# Patient Record
Sex: Female | Born: 1985 | Race: White | Hispanic: No | Marital: Single | Smoking: Never smoker
Health system: Southern US, Community
[De-identification: ages and names within clinical notes are randomized; demographics above are authoritative.]

## PROBLEM LIST (undated history)

## (undated) DIAGNOSIS — R87619 Unspecified abnormal cytological findings in specimens from cervix uteri: Secondary | ICD-10-CM

## (undated) HISTORY — PX: TONSILLECTOMY: SUR1361

## (undated) HISTORY — DX: Unspecified abnormal cytological findings in specimens from cervix uteri: R87.619

---
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
## 2009-12-21 ENCOUNTER — Emergency Department (HOSPITAL_COMMUNITY): Admission: EM | Admit: 2009-12-21 | Discharge: 2009-12-21 | Payer: Self-pay | Admitting: Emergency Medicine

## 2010-07-01 LAB — DIFFERENTIAL
Basophils Relative: 0 % (ref 0–1)
Lymphs Abs: 1.1 10*3/uL (ref 0.7–4.0)
Monocytes Relative: 6 % (ref 3–12)
Neutro Abs: 16.3 10*3/uL — ABNORMAL HIGH (ref 1.7–7.7)
Neutrophils Relative %: 88 % — ABNORMAL HIGH (ref 43–77)

## 2010-07-01 LAB — BASIC METABOLIC PANEL
Calcium: 9.9 mg/dL (ref 8.4–10.5)
GFR calc Af Amer: >60 mL/min (ref >60)
GFR calc non Af Amer: >60 mL/min (ref >60)
Sodium: 139 mEq/L (ref 135–145)

## 2010-07-01 LAB — CBC
Hemoglobin: 15.2 g/dL — ABNORMAL HIGH (ref 12.0–15.0)
MCHC: 33.5 g/dL (ref 30.0–36.0)
RBC: 4.86 MIL/uL (ref 3.87–5.11)
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
## 2012-06-02 ENCOUNTER — Emergency Department (HOSPITAL_COMMUNITY)
Admission: EM | Admit: 2012-06-02 | Discharge: 2012-06-03 | Disposition: A | Discharge status: Home / Self Care | Payer: Managed Care, Other (non HMO) | Attending: Emergency Medicine | Admitting: Emergency Medicine

## 2012-06-02 ENCOUNTER — Encounter (HOSPITAL_COMMUNITY): Payer: Self-pay

## 2012-06-02 DIAGNOSIS — R300 Dysuria: Secondary | ICD-10-CM | POA: Insufficient documentation

## 2012-06-02 DIAGNOSIS — N898 Other specified noninflammatory disorders of vagina: Secondary | ICD-10-CM | POA: Insufficient documentation

## 2012-06-02 DIAGNOSIS — N949 Unspecified condition associated with female genital organs and menstrual cycle: Secondary | ICD-10-CM | POA: Insufficient documentation

## 2012-06-02 DIAGNOSIS — R102 Pelvic and perineal pain: Secondary | ICD-10-CM

## 2012-06-02 DIAGNOSIS — Z3202 Encounter for pregnancy test, result negative: Secondary | ICD-10-CM | POA: Insufficient documentation

## 2012-06-02 LAB — URINALYSIS, ROUTINE W REFLEX MICROSCOPIC
Glucose, UA: NEGATIVE mg/dL
Hgb urine dipstick: NEGATIVE
Nitrite: NEGATIVE
Protein, ur: NEGATIVE mg/dL
Specific Gravity, Urine: 1.036 — ABNORMAL HIGH (ref 1.005–1.030)
Urobilinogen, UA: 0.2 mg/dL (ref 0.0–1.0)
pH: 5.5 (ref 5.0–8.0)

## 2012-06-02 LAB — URINE MICROSCOPIC-ADD ON

## 2012-06-02 LAB — WET PREP, GENITAL: Trich, Wet Prep: NONE SEEN

## 2012-06-02 LAB — PREGNANCY, URINE: Preg Test, Ur: NEGATIVE

## 2012-06-02 MED ORDER — OXYCODONE-ACETAMINOPHEN 5-325 MG PO TABS
2.0000 | ORAL_TABLET | Freq: Once | ORAL | Status: AC
  Administered 2012-06-02: 2 via ORAL
  Filled 2012-06-02: qty 1

## 2012-06-02 MED ORDER — ONDANSETRON 4 MG PO TBDP
4.0000 mg | ORAL_TABLET | Freq: Once | ORAL | Status: AC
  Administered 2012-06-02: 4 mg via ORAL
  Filled 2012-06-02: qty 1

## 2012-06-02 NOTE — ED Provider Notes (Signed)
History     CSN: [id number]  Arrival date & time 06/02/12  2043   First MD Initiated Contact with Patient 06/02/12 2123      Chief Complaint  Patient presents with  . Abdominal Pain  . Vaginal Discharge    (Consider location/radiation/quality/duration/timing/severity/associated sxs/prior treatment) HPI  Patient presents to the emergency department complaints of vaginal discharge and dysuria for one month. She was seen this past Monday and given metronidazole and Diflucan for before and after the antibiotic. He says she is now having more pain than when she was seen last week. She's not having any abdominal pain or flank pain. She denies having any fever or vomiting. He continues to have discharge. She was seen in the ER and then originally and they did not give her any azithromycin or Rocephin. She says that she was so uncomfortable this evening she could not wait until Monday. nad vss  History reviewed. No pertinent past medical history.  Past Surgical History  Procedure Laterality Date  . Tonsillectomy      History reviewed. No pertinent family history.  History  Substance Use Topics  . Smoking status: Never Smoker   . Smokeless tobacco: Not on file  . Alcohol Use: No    OB History   Grav Para Term Preterm Abortions TAB SAB Ect Mult Living                  Review of Systems  Review of Systems  Gen: no weight loss, fevers, chills, night sweats  Eyes: no discharge or drainage, no occular pain or visual changes  Nose: no epistaxis or rhinorrhea  Mouth: no dental pain, no sore throat  Neck: no neck pain  Lungs:No wheezing, coughing or hemoptysis CV: no chest pain, palpitations, dependent edema or orthopnea  Abd: no abdominal pain, nausea, vomiting  GU: no dysuria or gross hematuria + vaginal discharge and burning during urination MSK:  No abnormalities  Neuro: no headache, no focal neurologic deficits  Skin: no abnormalities Psyche: negative.   Allergies   Latex  Home Medications   Current Outpatient Rx  Name  Route  Sig  Dispense  Refill  . metroNIDAZOLE (FLAGYL) 500 MG tablet   Oral   Take 500 mg by mouth 2 (two) times daily. Stop dat 06/04/2012         . Norgestim-Eth Estrad Triphasic (TRI-SPRINTEC PO)   Oral   Take 1 tablet by mouth daily.         Marland Kitchen HYDROcodone-acetaminophen (NORCO/VICODIN) 5-325 MG per tablet   Oral   Take 1 tablet by mouth every 4 (four) hours as needed for pain.   10 tablet   0     BP 118/72  Pulse 69  Temp(Src) 98.1 F (36.7 C) (Oral)  Resp 17  SpO2 97%  LMP 05/15/2012  Physical Exam  Nursing note and vitals reviewed. Constitutional: She appears well-developed and well-nourished. No distress.  HENT:  Head: Normocephalic and atraumatic.  Eyes: Pupils are equal, round, and reactive to light.  Neck: Normal range of motion. Neck supple.  Cardiovascular: Normal rate and regular rhythm.   Pulmonary/Chest: Effort normal.  Abdominal: Soft.  Genitourinary: There is no rash or tenderness on the right labia. There is no rash or tenderness on the left labia. Uterus is tender. Cervix exhibits discharge. Right adnexum displays tenderness. There is tenderness around the vagina. Vaginal discharge found.  Neurological: She is alert.  Skin: Skin is warm and dry.    ED Course  Procedures (including critical care time)  Labs Reviewed  WET PREP, GENITAL - Abnormal; Notable for the following:    WBC, Wet Prep HPF POC FEW (*)    All other components within normal limits  URINALYSIS, ROUTINE W REFLEX MICROSCOPIC - Abnormal; Notable for the following:    Color, Urine AMBER (*)    APPearance CLOUDY (*)    Specific Gravity, Urine 1.036 (*)    Bilirubin Urine SMALL (*)    Ketones, ur TRACE (*)    Leukocytes, UA SMALL (*)    All other components within normal limits  GC/CHLAMYDIA PROBE AMP  PREGNANCY, URINE  URINE MICROSCOPIC-ADD ON   US Transvaginal Non-ob  06/03/2012  *RADIOLOGY REPORT*  Clinical  Data:  Burning and pain with urination.  TRANSABDOMINAL AND TRANSVAGINAL ULTRASOUND OF PELVIS DOPPLER ULTRASOUND OF OVARIES  Technique:  Both transabdominal and transvaginal ultrasound examinations of the pelvis were performed. Transabdominal technique was performed for global imaging of the pelvis including uterus, ovaries, adnexal regions, and pelvic cul-de-sac.  It was necessary to proceed with endovaginal exam following the transabdominal exam to visualize the uterus and ovaries in greater detail.  Color and duplex Doppler ultrasound was utilized to evaluate blood flow to the ovaries.  Comparison:  CT of the abdomen and pelvis performed 12/21/2009  Findings:  Uterus:  Normal in size and appearance; measures 7.0 x 3.2 x 3.8 cm.  A Nabothian cyst is noted at the cervix.  Endometrium:  Normal in thickness and appearance; measures 0.7 cm in thickness.  Right ovary: Difficult to characterize; measures 1.7 x 1.4 x 1.2 cm.  No definite adnexal masses seen.  Left ovary:   Normal appearance/no adnexal mass; measures 2.8 x 1.1 x 1.6 cm.  Pulsed Doppler evaluation demonstrates normal low-resistance arterial and venous waveforms in the left ovary; the right ovary is difficult to characterize, but there is no ancillary evidence for ovarian torsion.  IMPRESSION: Normal exam.  No evidence of pelvic mass or other significant abnormality.  No sonographic evidence for ovarian torsion.  The right ovary is difficult to characterize, but no ancillary findings are seen to suggest ovarian torsion.   Original Report Authenticated By: Tonia Ghent, M.D.    US Pelvis Complete  06/03/2012  *RADIOLOGY REPORT*  Clinical Data:  Burning and pain with urination.  TRANSABDOMINAL AND TRANSVAGINAL ULTRASOUND OF PELVIS DOPPLER ULTRASOUND OF OVARIES  Technique:  Both transabdominal and transvaginal ultrasound examinations of the pelvis were performed. Transabdominal technique was performed for global imaging of the pelvis including uterus,  ovaries, adnexal regions, and pelvic cul-de-sac.  It was necessary to proceed with endovaginal exam following the transabdominal exam to visualize the uterus and ovaries in greater detail.  Color and duplex Doppler ultrasound was utilized to evaluate blood flow to the ovaries.  Comparison:  CT of the abdomen and pelvis performed 12/21/2009  Findings:  Uterus:  Normal in size and appearance; measures 7.0 x 3.2 x 3.8 cm.  A Nabothian cyst is noted at the cervix.  Endometrium:  Normal in thickness and appearance; measures 0.7 cm in thickness.  Right ovary: Difficult to characterize; measures 1.7 x 1.4 x 1.2 cm.  No definite adnexal masses seen.  Left ovary:   Normal appearance/no adnexal mass; measures 2.8 x 1.1 x 1.6 cm.  Pulsed Doppler evaluation demonstrates normal low-resistance arterial and venous waveforms in the left ovary; the right ovary is difficult to characterize, but there is no ancillary evidence for ovarian torsion.  IMPRESSION: Normal exam.  No evidence of  pelvic mass or other significant abnormality.  No sonographic evidence for ovarian torsion.  The right ovary is difficult to characterize, but no ancillary findings are seen to suggest ovarian torsion.   Original Report Authenticated By: Tonia Ghent, M.D.    [place] Art/ven Flow Abd Pelv Doppler  06/03/2012  *RADIOLOGY REPORT*  Clinical Data:  Burning and pain with urination.  TRANSABDOMINAL AND TRANSVAGINAL ULTRASOUND OF PELVIS DOPPLER ULTRASOUND OF OVARIES  Technique:  Both transabdominal and transvaginal ultrasound examinations of the pelvis were performed. Transabdominal technique was performed for global imaging of the pelvis including uterus, ovaries, adnexal regions, and pelvic cul-de-sac.  It was necessary to proceed with endovaginal exam following the transabdominal exam to visualize the uterus and ovaries in greater detail.  Color and duplex Doppler ultrasound was utilized to evaluate blood flow to the ovaries.  Comparison:  CT of the  abdomen and pelvis performed 12/21/2009  Findings:  Uterus:  Normal in size and appearance; measures 7.0 x 3.2 x 3.8 cm.  A Nabothian cyst is noted at the cervix.  Endometrium:  Normal in thickness and appearance; measures 0.7 cm in thickness.  Right ovary: Difficult to characterize; measures 1.7 x 1.4 x 1.2 cm.  No definite adnexal masses seen.  Left ovary:   Normal appearance/no adnexal mass; measures 2.8 x 1.1 x 1.6 cm.  Pulsed Doppler evaluation demonstrates normal low-resistance arterial and venous waveforms in the left ovary; the right ovary is difficult to characterize, but there is no ancillary evidence for ovarian torsion.  IMPRESSION: Normal exam.  No evidence of pelvic mass or other significant abnormality.  No sonographic evidence for ovarian torsion.  The right ovary is difficult to characterize, but no ancillary findings are seen to suggest ovarian torsion.   Original Report Authenticated By: Tonia Ghent, M.D.      1. Pelvic pain       MDM  Lab results are unremarkable. Due to her complaints of continued burning that is intolerable, I have added on a  [place] to r/o torsion or ovarian cysts.   Normal [place]. Pt given Azithro and Rocephin in ED for small amount of WBCs on wet prep. Will give referral to Gyn and short course of pain medications.  Pt has been advised of the symptoms that warrant their return to the ED. Patient has voiced understanding and has agreed to follow-up with the PCP or specialist.       Dorthula Matas, PA 06/03/12 [phone]

## 2012-06-02 NOTE — ED Notes (Signed)
Pt states she has been having difficulty with vaginal discharge and abdominal pain x 1 month.  States dx with bv on Monday and given antibiotic.  Pt states pain is worsening.  Pain increases with urination.  No flank pain.  No fever.   No blood in urine noted.

## 2012-06-03 ENCOUNTER — Emergency Department (HOSPITAL_COMMUNITY): Payer: Managed Care, Other (non HMO)

## 2012-06-03 MED ORDER — CEFTRIAXONE SODIUM 250 MG IJ SOLR
250.0000 mg | Freq: Once | INTRAMUSCULAR | Status: AC
  Administered 2012-06-03: 250 mg via INTRAMUSCULAR
  Filled 2012-06-03: qty 250

## 2012-06-03 MED ORDER — LIDOCAINE HCL 1 % IJ SOLN
INTRAMUSCULAR | Status: AC
  Administered 2012-06-03: 1 mL
  Filled 2012-06-03: qty 20

## 2012-06-03 MED ORDER — OXYCODONE-ACETAMINOPHEN 5-325 MG PO TABS
1.0000 | ORAL_TABLET | Freq: Once | ORAL | Status: AC
  Administered 2012-06-03: 1 via ORAL
  Filled 2012-06-03: qty 1

## 2012-06-03 MED ORDER — HYDROCODONE-ACETAMINOPHEN 5-325 MG PO TABS: 1.0000 | ORAL_TABLET | ORAL | Status: DC | PRN

## 2012-06-03 MED ORDER — AZITHROMYCIN 250 MG PO TABS
500.0000 mg | ORAL_TABLET | Freq: Once | ORAL | Status: AC
  Administered 2012-06-03: 500 mg via ORAL
  Filled 2012-06-03: qty 2

## 2012-06-03 NOTE — ED Provider Notes (Signed)
Medical screening examination/treatment/procedure(s) were performed by non-physician practitioner and as supervising physician I was immediately available for consultation/collaboration.  Timeka Goette, MD 06/03/12 1500 

## 2012-06-03 NOTE — ED Notes (Signed)
Informed by [employer] that they will be here in over an hour.  Pt and PA made aware.

## 2012-06-04 LAB — GC/CHLAMYDIA PROBE AMP: CT Probe RNA: NEGATIVE

## 2013-06-12 ENCOUNTER — Encounter: Payer: Self-pay | Admitting: Certified Nurse Midwife

## 2013-06-18 ENCOUNTER — Ambulatory Visit: Payer: Self-pay | Admitting: Certified Nurse Midwife

## 2013-06-28 ENCOUNTER — Encounter: Payer: Self-pay | Admitting: Certified Nurse Midwife

## 2013-06-28 ENCOUNTER — Ambulatory Visit (INDEPENDENT_AMBULATORY_CARE_PROVIDER_SITE_OTHER): Payer: Managed Care, Other (non HMO) | Admitting: Certified Nurse Midwife

## 2013-06-28 VITALS — BP 119/84 | HR 86 | Resp 16 | Ht 65.25 in | Wt 197.0 lb

## 2013-06-28 DIAGNOSIS — Z0000 Encounter for general adult medical examination without abnormal findings: Secondary | ICD-10-CM

## 2013-06-28 DIAGNOSIS — Z309 Encounter for contraceptive management, unspecified: Secondary | ICD-10-CM

## 2013-06-28 DIAGNOSIS — Z01419 Encounter for gynecological examination (general) (routine) without abnormal findings: Secondary | ICD-10-CM

## 2013-06-28 LAB — POCT URINALYSIS DIPSTICK
Bilirubin, UA: NEGATIVE
Glucose, UA: NEGATIVE
Ketones, UA: NEGATIVE
Leukocytes, UA: NEGATIVE
Nitrite, UA: NEGATIVE
PH UA: 5
PROTEIN UA: NEGATIVE
RBC UA: NEGATIVE
UROBILINOGEN UA: NEGATIVE

## 2013-06-28 LAB — HEMOGLOBIN, FINGERSTICK: Hemoglobin, fingerstick: 14.3 g/dL (ref 12.0–16.0)

## 2013-06-28 MED ORDER — NORGESTIM-ETH ESTRAD TRIPHASIC 0.18/0.215/0.25 MG-35 MCG PO TABS: 1.0000 | ORAL_TABLET | Freq: Every day | ORAL | Status: DC

## 2013-06-28 NOTE — Progress Notes (Signed)
28 y.o. G0P0000 Single Caucasian Fe here for annual exam. Periods normal no issues. Contraception working well, no problems. Partner change, so desires STD screening. Good year. No other health changes today or concerns.    Patient's last menstrual period was 06/07/2013.          Sexually active: yes  The current method of family planning is OCP (estrogen/progesterone).    Exercising: no  exercise Smoker:  no  Health Maintenance: Pap:  12-13 neg MMG:  2010 Colonoscopy:  none BMD:   none TDaP:  2007 Labs: Poct urine-neg, Hgb-14.3 Self breast exam: done monthly   reports that she has never smoked. She does not have any smokeless tobacco history on file. She reports that she does not drink alcohol or use illicit drugs.  No past medical history on file.  Past Surgical History  Procedure Laterality Date  . Tonsillectomy      Current Outpatient Prescriptions  Medication Sig Dispense Refill  . HYDROcodone-acetaminophen (NORCO/VICODIN) 5-325 MG per tablet Take 1 tablet by mouth every 4 (four) hours as needed for pain.  10 tablet  0  . metroNIDAZOLE (FLAGYL) 500 MG tablet Take 500 mg by mouth 2 (two) times daily. Stop dat 06/04/2012      . Norgestim-Eth Estrad Triphasic (TRI-SPRINTEC PO) Take 1 tablet by mouth daily.       No current facility-administered medications for this visit.    Family History  Problem Relation Age of Onset  . Hypertension Mother   . Breast cancer Maternal Grandmother   . Cancer Maternal Grandfather     lung    ROS:  Pertinent items are noted in HPI.  Otherwise, a comprehensive ROS was negative.  Exam:   Ht 5' 5.25" (1.657 m)  Wt 197 lb (89.359 kg)  BMI 32.55 kg/m2  LMP 06/07/2013 Height: 5' 5.25" (165.7 cm)  Ht Readings from Last 3 Encounters:  06/28/13 5' 5.25" (1.657 m)    General appearance: alert, cooperative and appears stated age Head: Normocephalic, without obvious abnormality, atraumatic Neck: no adenopathy, supple, symmetrical, trachea  midline and thyroid normal to inspection and palpation and non-palpable Lungs: clear to auscultation bilaterally Breasts: normal appearance, no masses or tenderness, No nipple retraction or dimpling, No nipple discharge or bleeding, No axillary or supraclavicular adenopathy Heart: regular rate and rhythm Abdomen: soft, non-tender; no masses,  no organomegaly Extremities: extremities normal, atraumatic, no cyanosis or edema Skin: Skin color, texture, turgor normal. No rashes or lesions Lymph nodes: Cervical, supraclavicular, and axillary nodes normal. No abnormal inguinal nodes palpated Neurologic: Grossly normal   Pelvic: External genitalia:  no lesions              Urethra:  normal appearing urethra with no masses, tenderness or lesions              Bartholin's and Skene's: normal                 Vagina: normal appearing vagina with normal color and discharge, no lesions              Cervix: normal, non tender              Pap taken: no Bimanual Exam:  Uterus:  normal size, contour, position, consistency, mobility, non-tender and anteverted              Adnexa: normal adnexa and no mass, fullness, tenderness               Rectovaginal: Confirms  Anus:  normal sphincter tone, no lesions  A:  Well Woman with normal exam  Contraception OCP desired  STD screening  P:   Reviewed health and wellness pertinent to exam  Rx Trisprintec see order  Lab:GC,Chlamydia,HIV,RPR  Pap smear as per guidelines   pap smear not taken today  counseled on breast self exam, STD prevention, HIV risk factors and prevention, use and side effects of OCP's, adequate intake of calcium and vitamin D  return annually or prn  An After Visit Summary was printed and given to the patient.

## 2013-06-28 NOTE — Patient Instructions (Signed)
General topics  Next pap or exam is  due in 1 year Take a Women's multivitamin Take 1200 mg. of calcium daily - prefer dietary If any concerns in interim to call back  Breast Self-Awareness Practicing breast self-awareness may pick up problems early, prevent significant medical complications, and possibly save your life. By practicing breast self-awareness, you can become familiar with how your breasts look and feel and if your breasts are changing. This allows you to notice changes early. It can also offer you some reassurance that your breast health is good. One way to learn what is normal for your breasts and whether your breasts are changing is to do a breast self-exam. If you find a lump or something that was not present in the past, it is best to contact your caregiver right away. Other findings that should be evaluated by your caregiver include nipple discharge, especially if it is bloody; skin changes or reddening; areas where the skin seems to be pulled in (retracted); or new lumps and bumps. Breast pain is seldom associated with cancer (malignancy), but should also be evaluated by a caregiver. BREAST SELF-EXAM The best time to examine your breasts is 5 7 days after your menstrual period is over.  ExitCare Patient Information 2013 [employer].   Exercise to Stay Healthy Exercise helps you become and stay healthy. EXERCISE IDEAS AND TIPS Choose exercises that:  You enjoy.  Fit into your day. You do not need to exercise really hard to be healthy. You can do exercises at a slow or medium level and stay healthy. You can:  Stretch before and after working out.  Try yoga, Pilates, or tai chi.  Lift weights.  Walk fast, swim, jog, run, climb stairs, bicycle, dance, or rollerskate.  Take aerobic classes. Exercises that burn about 150 calories:  Running 1  miles in 15 minutes.  Playing volleyball for 45 to 60 minutes.  Washing and waxing a car for 45 to 60  minutes.  Playing touch football for 45 minutes.  Walking 1  miles in 35 minutes.  Pushing a stroller 1  miles in 30 minutes.  Playing basketball for 30 minutes.  Raking leaves for 30 minutes.  Bicycling 5 miles in 30 minutes.  Walking 2 miles in 30 minutes.  Dancing for 30 minutes.  Shoveling snow for 15 minutes.  Swimming laps for 20 minutes.  Walking up stairs for 15 minutes.  Bicycling 4 miles in 15 minutes.  Gardening for 30 to 45 minutes.  Jumping rope for 15 minutes.  Washing windows or floors for 45 to 60 minutes. Document Released: 05/07/2010 Document Revised: 06/27/2011 Document Reviewed: 05/07/2010 ExitCare Patient Information 2013 [employer].   Other topics ( that may be useful information):    Sexually Transmitted Disease Sexually transmitted disease (STD) refers to any infection that is passed from person to person during sexual activity. This may happen by way of saliva, semen, blood, vaginal mucus, or urine. Common STDs include:  Gonorrhea.  Chlamydia.  Syphilis.  HIV/AIDS.  Genital herpes.  Hepatitis B and C.  Trichomonas.  Human papillomavirus (HPV).  Pubic lice. CAUSES  An STD may be spread by bacteria, virus, or parasite. A person can get an STD by:  Sexual intercourse with an infected person.  Sharing sex toys with an infected person.  Sharing needles with an infected person.  Having intimate contact with the genitals, mouth, or rectal areas of an infected person. SYMPTOMS  Some people may not have any symptoms, but   not have any symptoms, but they can still pass the infection to others. Different STDs have different symptoms. Symptoms include:  Painful or bloody urination.  Pain in the pelvis, abdomen, vagina, anus, throat, or eyes.  Skin rash, itching, irritation, growths, or sores (lesions). These usually occur in the genital or anal area.  Abnormal vaginal discharge.  Penile discharge in men.  Soft, flesh-colored skin growths in the  genital or anal area.  Fever.  Pain or bleeding during sexual intercourse.  Swollen glands in the groin area.  Yellow skin and eyes (jaundice). This is seen with hepatitis. DIAGNOSIS  To make a diagnosis, your caregiver may:  Take a medical history.  Perform a physical exam.  Take a specimen (culture) to be examined.  Examine a sample of discharge under a microscope.  Perform blood test TREATMENT   Chlamydia, gonorrhea, trichomonas, and syphilis can be cured with antibiotic medicine.  Genital herpes, hepatitis, and HIV can be treated, but not cured, with prescribed medicines. The medicines will lessen the symptoms.  Genital warts from HPV can be treated with medicine or by freezing, burning (electrocautery), or surgery. Warts may come back.  HPV is a virus and cannot be cured with medicine or surgery.However, abnormal areas may be followed very closely by your caregiver and may be removed from the cervix, vagina, or vulva through office procedures or surgery. If your diagnosis is confirmed, your recent sexual partners need treatment. This is true even if they are symptom-free or have a negative culture or evaluation. They should not have sex until their caregiver says it is okay. HOME CARE INSTRUCTIONS  All sexual partners should be informed, tested, and treated for all STDs.  Take your antibiotics as directed. Finish them even if you start to feel better.  Only take over-the-counter or prescription medicines for pain, discomfort, or fever as directed by your caregiver.  Rest.  Eat a balanced diet and drink enough fluids to keep your urine clear or pale yellow.  Do not have sex until treatment is completed and you have followed up with your caregiver. STDs should be checked after treatment.  Keep all follow-up appointments, Pap tests, and blood tests as directed by your caregiver.  Only use latex condoms and water-soluble lubricants during sexual activity. Do not use  petroleum jelly or oils.  Avoid alcohol and illegal drugs.  Get vaccinated for HPV and hepatitis. If you have not received these vaccines in the past, talk to your caregiver about whether one or both might be right for you.  Avoid risky sex practices that can break the skin. The only way to avoid getting an STD is to avoid all sexual activity.Latex condoms and dental dams (for oral sex) will help lessen the risk of getting an STD, but will not completely eliminate the risk. SEEK MEDICAL CARE IF:   You have a fever.  You have any new or worsening symptoms. Document Released: 06/25/2002 Document Revised: 06/27/2011 Document Reviewed: 07/02/2010 [facility] Patient Information 2013 [place].    Domestic Abuse You are being battered or abused if someone close to you hits, pushes, or physically hurts you in any way. You also are being abused if you are forced into activities. You are being sexually abused if you are forced to have sexual contact of any kind. You are being emotionally abused if you are made to feel worthless or if you are constantly threatened. It is important to remember that help is available. No one has the right to  ABUSE  Learn the warning signs of danger. This varies with situations but may include: the use of alcohol, threats, isolation from friends and family, or forced sexual contact. Leave if you feel that violence is going to occur.  If you are attacked or beaten, report it to the police so the abuse is documented. You do not have to press charges. The police can protect you while you or the attackers are leaving. Get the officer's name and badge number and a copy of the report.  Find someone you can trust and tell them what is happening to you: your caregiver, a nurse, clergy member, close friend or family member. Feeling ashamed is natural, but remember that you have done nothing wrong. No one deserves abuse. Document Released:  04/01/2000 Document Revised: 06/27/2011 Document Reviewed: 06/10/2010 ExitCare Patient Information 2013 [employer].    How Much is Too Much Alcohol? Drinking too much alcohol can cause injury, accidents, and health problems. These types of problems can include:   Car crashes.  Falls.  Family fighting (domestic violence).  Drowning.  Fights.  Injuries.  Burns.  Damage to certain organs.  Having a baby with birth defects. ONE DRINK CAN BE TOO MUCH WHEN YOU ARE:  Working.  Pregnant or breastfeeding.  Taking medicines. Ask your doctor.  Driving or planning to drive. If you or someone you know has a drinking problem, get help from a doctor.  Document Released: 01/29/2009 Document Revised: 06/27/2011 Document Reviewed: 01/29/2009 ExitCare Patient Information 2013 [employer].   Smoking Hazards Smoking cigarettes is extremely bad for your health. Tobacco smoke has over 200 known poisons in it. There are over 60 chemicals in tobacco smoke that cause cancer. Some of the chemicals found in cigarette smoke include:   Cyanide.  Benzene.  Formaldehyde.  Methanol (wood alcohol).  Acetylene (fuel used in welding torches).  Ammonia. Cigarette smoke also contains the poisonous gases nitrogen oxide and carbon monoxide.  Cigarette smokers have an increased risk of many serious medical problems and Smoking causes approximately:  90% of all lung cancer deaths in men.  80% of all lung cancer deaths in women.  90% of deaths from chronic obstructive lung disease. Compared with nonsmokers, smoking increases the risk of:  Coronary heart disease by 2 to 4 times.  Stroke by 2 to 4 times.  Men developing lung cancer by 23 times.  Women developing lung cancer by 13 times.  Dying from chronic obstructive lung diseases by 12 times.  . Smoking is the most preventable cause of death and disease in our society.  WHY IS SMOKING ADDICTIVE?  Nicotine is the chemical  agent in tobacco that is capable of causing addiction or dependence.  When you smoke and inhale, nicotine is absorbed rapidly into the bloodstream through your lungs. Nicotine absorbed through the lungs is capable of creating a powerful addiction. Both inhaled and non-inhaled nicotine may be addictive.  Addiction studies of cigarettes and spit tobacco show that addiction to nicotine occurs mainly during the teen years, when young people begin using tobacco products. WHAT ARE THE BENEFITS OF QUITTING?  There are many health benefits to quitting smoking.   Likelihood of developing cancer and heart disease decreases. Health improvements are seen almost immediately.  Blood pressure, pulse rate, and breathing patterns start returning to normal soon after quitting. QUITTING SMOKING   American Lung Association - 1-800-LUNGUSA  American Cancer Society - 1-800-ACS-2345 Document Released: 05/12/2004 Document Revised: 06/27/2011 Document Reviewed: 01/14/2009 ExitCare Patient Information 2013 ExitCare,   ExitCare Patient Information 2013 [place].   Stress Management Stress is a state of physical or mental tension that often results from changes in your life or normal routine. Some common causes of stress are:  Death of a loved one.  Injuries or severe illnesses.  Getting fired or changing jobs.  Moving into a new home. Other causes may be:  Sexual problems.  Business or financial losses.  Taking on a large debt.  Regular conflict with someone at home or at work.  Constant tiredness from lack of sleep. It is not just bad things that are stressful. It may be stressful to:  Win the lottery.  Get married.  Buy a new car. The amount of stress that can be easily tolerated varies from person to person. Changes generally cause stress, regardless of the types of change. Too much stress can affect your health. It may lead to physical or emotional problems. Too little stress (boredom) may also become stressful. SUGGESTIONS TO  REDUCE STRESS:  Talk things over with your family and friends. It often is helpful to share your concerns and worries. If you feel your problem is serious, you may want to get help from a professional counselor.  Consider your problems one at a time instead of lumping them all together. Trying to take care of everything at once may seem impossible. List all the things you need to do and then start with the most important one. Set a goal to accomplish 2 or 3 things each day. If you expect to do too many in a single day you will naturally fail, causing you to feel even more stressed.  Do not use alcohol or drugs to relieve stress. Although you may feel better for a short time, they do not remove the problems that caused the stress. They can also be habit forming.  Exercise regularly - at least 3 times per week. Physical exercise can help to relieve that "uptight" feeling and will relax you.  The shortest distance between despair and hope is often a good night's sleep.  Go to bed and get up on time allowing yourself time for appointments without being rushed.  Take a short "time-out" period from any stressful situation that occurs during the day. Close your eyes and take some deep breaths. Starting with the muscles in your face, tense them, hold it for a few seconds, then relax. Repeat this with the muscles in your neck, shoulders, hand, stomach, back and legs.  Take good care of yourself. Eat a balanced diet and get plenty of rest.  Schedule time for having fun. Take a break from your daily routine to relax. HOME CARE INSTRUCTIONS   Call if you feel overwhelmed by your problems and feel you can no longer manage them on your own.  Return immediately if you feel like hurting yourself or someone else. Document Released: 09/28/2000 Document Revised: 06/27/2011 Document Reviewed: 05/21/2007 [facility] Patient Information 2013 [place].  It was good to see you today. Enjoy your  spring! Debbi

## 2013-06-29 LAB — RPR

## 2013-06-29 LAB — HIV ANTIBODY (ROUTINE TESTING W REFLEX): HIV: NONREACTIVE

## 2013-07-03 LAB — IPS N GONORRHOEA AND CHLAMYDIA BY PCR

## 2013-07-05 NOTE — Progress Notes (Signed)
Reviewed personally.  M. Suzanne Nolyn Swab, MD.  

## 2014-06-06 IMAGING — US US ART/VEN ABD/PELV/SCROTUM DOPPLER LTD
1 series · 13 of 25 positions shown · non-contrast
Comparison: CT of the abdomen and pelvis performed 12/21/2009

CLINICAL DATA: Burning and pain with urination.

TRANSABDOMINAL AND TRANSVAGINAL ULTRASOUND OF PELVIS
DOPPLER ULTRASOUND OF OVARIES
TECHNIQUE: Both transabdominal and transvaginal ultrasound
examinations of the pelvis were performed. Transabdominal technique
was performed for global imaging of the pelvis including uterus,
ovaries, adnexal regions, and pelvic cul-de-sac.
It was necessary to proceed with endovaginal exam following the
transabdominal exam to visualize the uterus and ovaries in greater
detail.
Color and duplex Doppler ultrasound was utilized to evaluate blood
flow to the ovaries.

[Series 1: us art/ven abd/pelv/scrotum doppler ltd · 0.31mm/px · 13 of 50 slices shown]
[im 1/50]
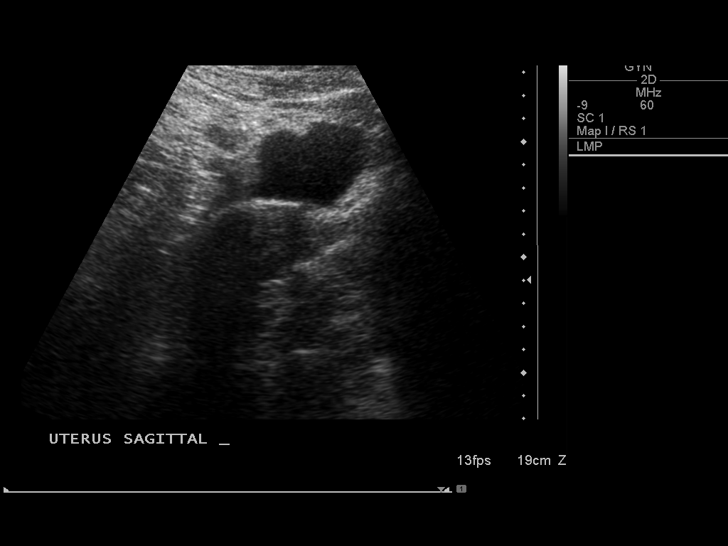
[im 5/50]
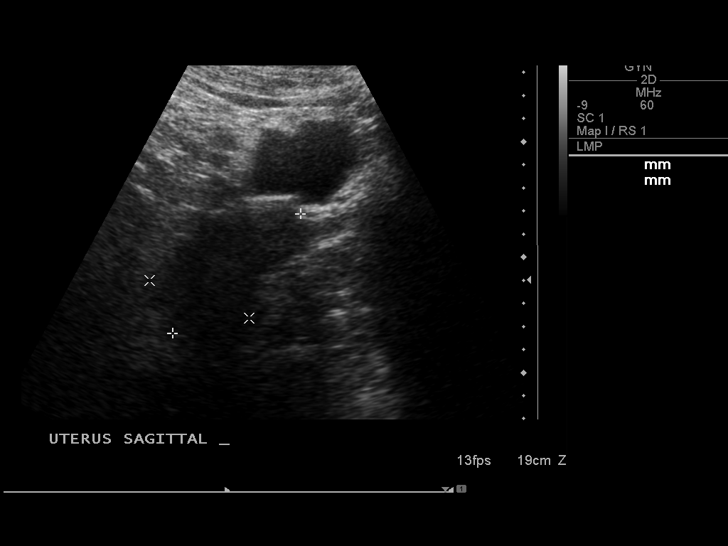
[im 9/50]
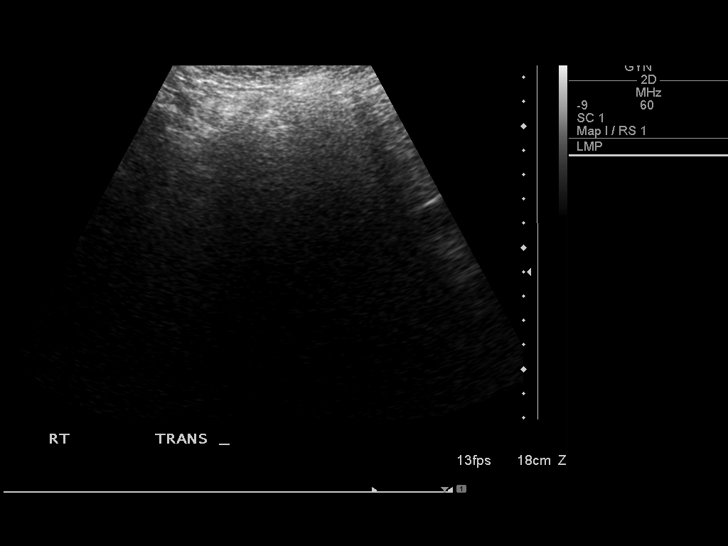
[im 13/50]
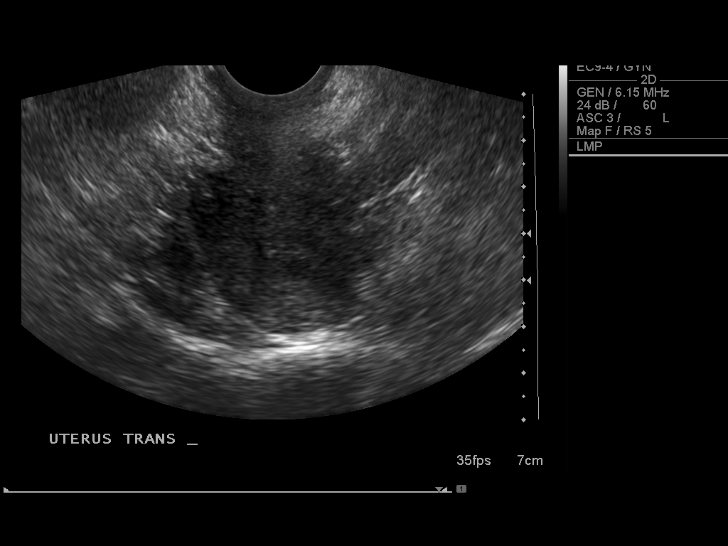
[im 17/50]
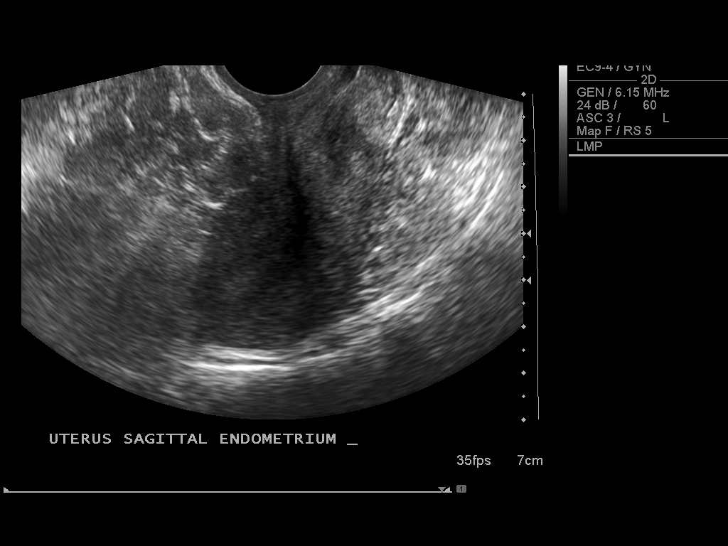
[im 21/50]
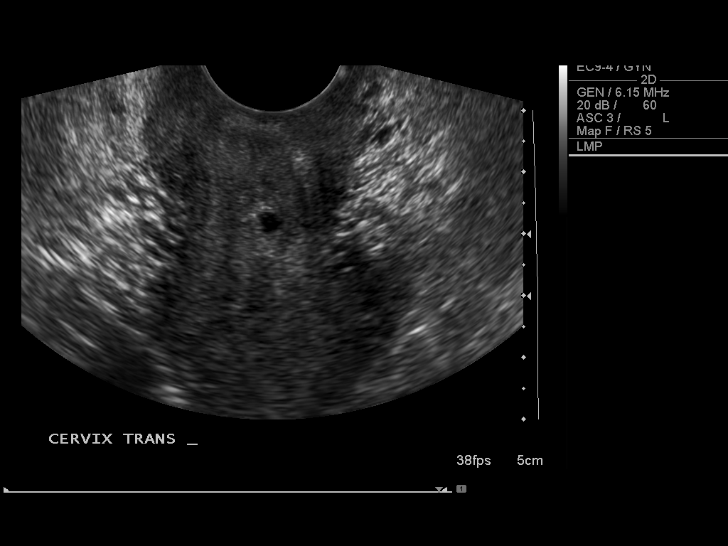
[im 25/50]
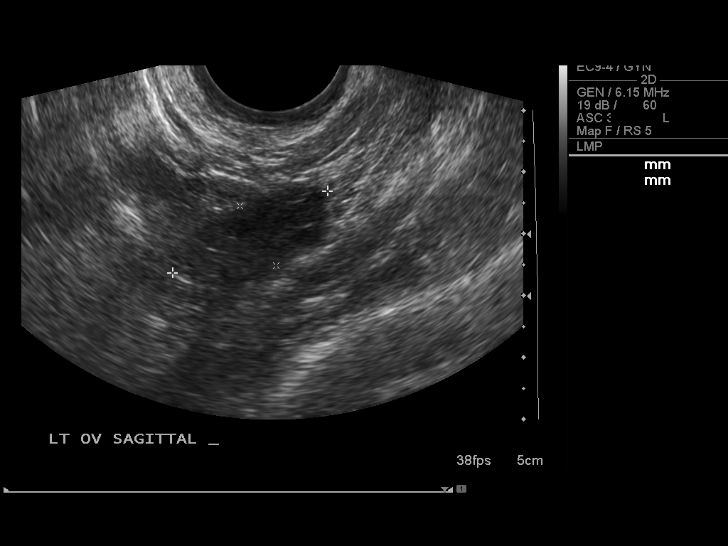
[im 29/50]
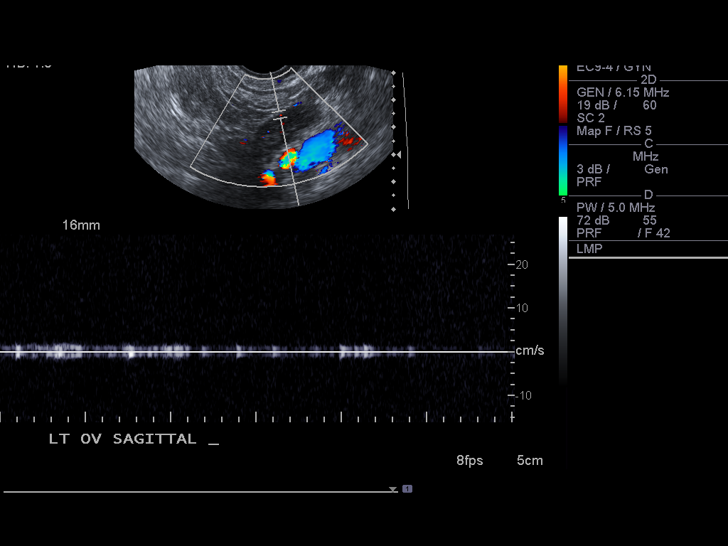
[im 33/50]
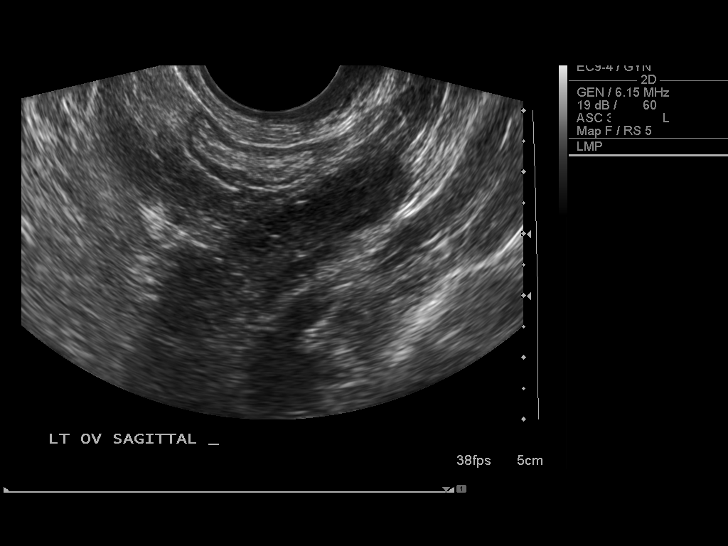
[im 37/50]
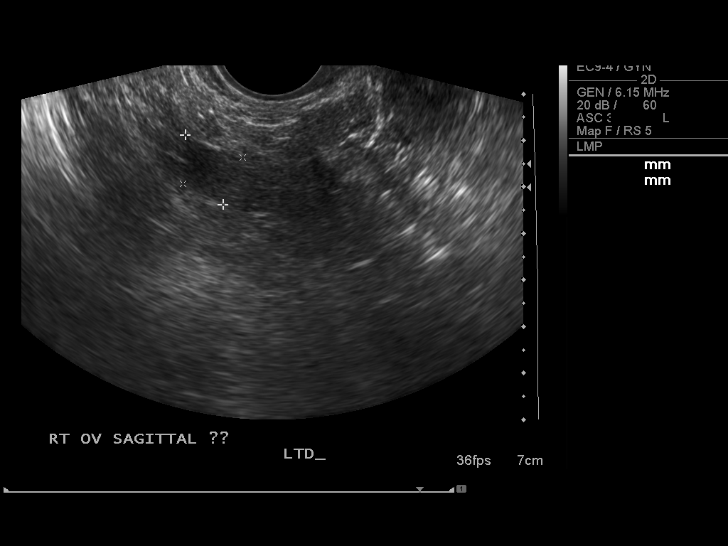
[im 41/50]
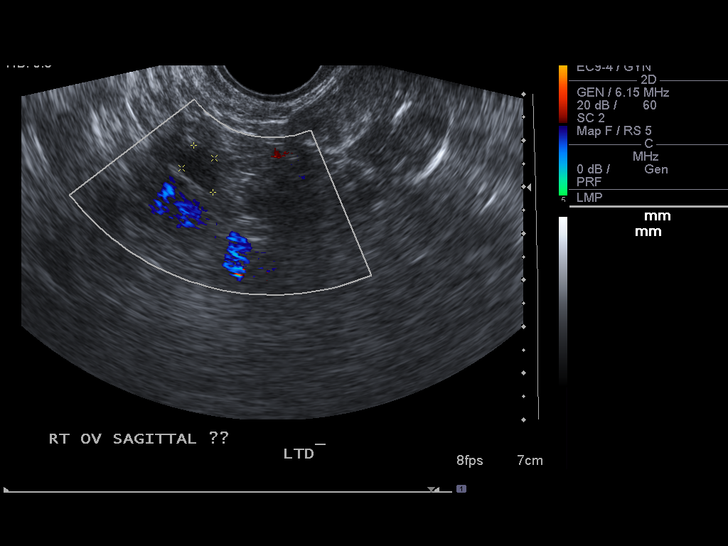
[im 45/50]
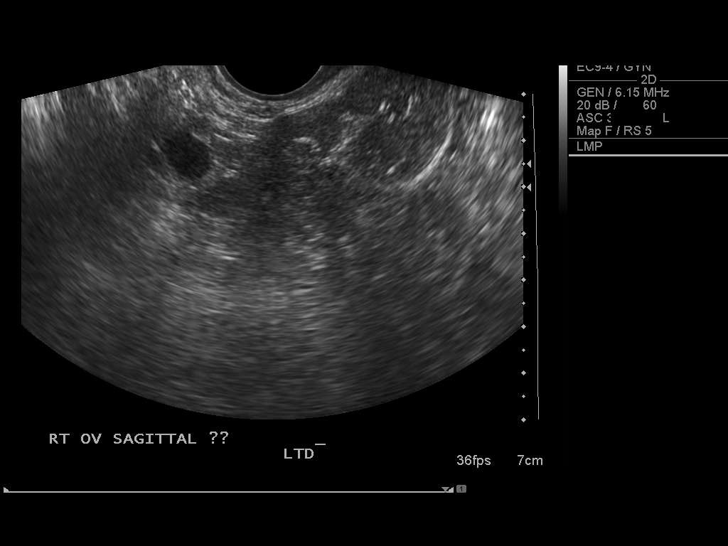
[im 50/50]
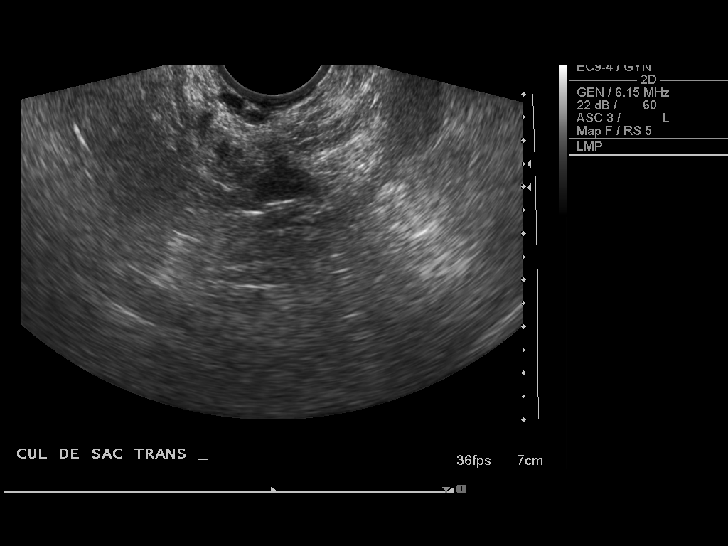

[13 of 25 positions shown; findings below may reference images not displayed]

FINDINGS: Uterus:  Normal in size and appearance; measures 7.0 x 3.2 x
cm.  A Nabothian cyst is noted at the cervix.

Endometrium:  Normal in thickness and appearance; measures 0.7 cm
in thickness.

Right ovary: Difficult to characterize; measures 1.7 x 1.4 x
cm.  No definite adnexal masses seen.

Left ovary:   Normal appearance/no adnexal mass; measures 2.8 x
x 1.6 cm.

Pulsed Doppler evaluation demonstrates normal low-resistance
arterial and venous waveforms in the left ovary; the right ovary is
difficult to characterize, but there is no ancillary evidence for
ovarian torsion.
IMPRESSION: Normal exam.  No evidence of pelvic mass or other significant
abnormality.

No sonographic evidence for ovarian torsion.  The right ovary is
difficult to characterize, but no ancillary findings are seen to
suggest ovarian torsion.

## 2014-06-09 ENCOUNTER — Other Ambulatory Visit: Payer: Self-pay | Admitting: Certified Nurse Midwife

## 2014-06-09 DIAGNOSIS — Z3040 Encounter for surveillance of contraceptives, unspecified: Secondary | ICD-10-CM

## 2014-06-09 MED ORDER — NORGESTIM-ETH ESTRAD TRIPHASIC 0.18/0.215/0.25 MG-35 MCG PO TABS: 1.0000 | ORAL_TABLET | Freq: Every day | ORAL | Status: DC

## 2014-06-09 NOTE — Telephone Encounter (Signed)
Pt rescheduled aex for 09/01/14 and requests refill on birth control until her appointment.

## 2014-06-09 NOTE — Telephone Encounter (Signed)
Medication refill request: OCP Last AEX:  06/28/13 DL Next AEX: 1/61/095/16/16 DL Last MMG (if hormonal medication request): None Refill authorized: 06/28/13 #3packs/4R. Today #3packs/0R?

## 2014-07-09 ENCOUNTER — Ambulatory Visit: Payer: Managed Care, Other (non HMO) | Admitting: Certified Nurse Midwife

## 2014-08-19 ENCOUNTER — Other Ambulatory Visit: Payer: Self-pay | Admitting: Certified Nurse Midwife

## 2014-08-20 NOTE — Telephone Encounter (Signed)
Medication refill request: Tri Sprintec  Last AEX:  06/28/13 DL Next AEX: 1/61/095/16/16 DL  Last MMG (if hormonal medication request): none Refill authorized: 06/09/14 #3packs/0R. Today #1pack/0R?

## 2014-09-01 ENCOUNTER — Encounter: Payer: Self-pay | Admitting: Certified Nurse Midwife

## 2014-09-01 ENCOUNTER — Ambulatory Visit (INDEPENDENT_AMBULATORY_CARE_PROVIDER_SITE_OTHER): Payer: 59 | Admitting: Certified Nurse Midwife

## 2014-09-01 VITALS — BP 102/70 | HR 70 | Resp 16 | Ht 65.25 in | Wt 226.0 lb

## 2014-09-01 DIAGNOSIS — Z124 Encounter for screening for malignant neoplasm of cervix: Secondary | ICD-10-CM | POA: Diagnosis not present

## 2014-09-01 DIAGNOSIS — Z01419 Encounter for gynecological examination (general) (routine) without abnormal findings: Secondary | ICD-10-CM

## 2014-09-01 DIAGNOSIS — Z0000 Encounter for general adult medical examination without abnormal findings: Secondary | ICD-10-CM | POA: Diagnosis not present

## 2014-09-01 DIAGNOSIS — Z3040 Encounter for surveillance of contraceptives, unspecified: Secondary | ICD-10-CM

## 2014-09-01 LAB — POCT URINALYSIS DIPSTICK
Bilirubin, UA: NEGATIVE
Blood, UA: NEGATIVE
GLUCOSE UA: NEGATIVE
Ketones, UA: NEGATIVE
LEUKOCYTES UA: NEGATIVE
NITRITE UA: NEGATIVE
Protein, UA: NEGATIVE
Urobilinogen, UA: NEGATIVE
pH, UA: 5

## 2014-09-01 LAB — LIPID PANEL
CHOL/HDL RATIO: 4.2 ratio
Cholesterol: 220 mg/dL — ABNORMAL HIGH (ref 0–200)
HDL: 52 mg/dL (ref >=46)
LDL Cholesterol: 148 mg/dL — ABNORMAL HIGH (ref 0–99)
Triglycerides: 100 mg/dL (ref <150)
VLDL: 20 mg/dL (ref 0–40)

## 2014-09-01 MED ORDER — NORGESTIM-ETH ESTRAD TRIPHASIC 0.18/0.215/0.25 MG-35 MCG PO TABS: 1.0000 | ORAL_TABLET | Freq: Every day | ORAL | Status: DC

## 2014-09-01 NOTE — Progress Notes (Signed)
29 y.o. G0P0000 Single  Caucasian Fe here for annual exam. Periods normal, no issues. No partner at present, so not sexually active. Contraception OCP desired again for contraception and cycle control. Ran out of OCP's. Aware she has gained weight 29 pounds over the past year. Had several extended family members who died and her pets have issues and she has felt stress eating is the problem. Plans working on weight loss and exercise with gym/trainer. Desires STD screening and cholesterol screening. No other health issues today. Sees urgent care if needed.  Patient's last menstrual period was 08/17/2014.          Sexually active: Yes.    The current method of family planning is OCP (estrogen/progesterone).    Exercising: No.  exercise Smoker:  no  Health Maintenance: Pap:  12/13 neg MMG:  2010 Colonoscopy:  none BMD:   none TDaP:  2009 Labs: Poct urine-neg, Hgb-14.3 Self breast exam: done weekly   reports that she has never smoked. She does not have any smokeless tobacco history on file. She reports that she does not drink alcohol or use illicit drugs.  History reviewed. No pertinent past medical history.  Past Surgical History  Procedure Laterality Date  . Tonsillectomy      Current Outpatient Prescriptions  Medication Sig Dispense Refill  . Norgestimate-Ethinyl Estradiol Triphasic 0.18/0.215/0.25 MG-35 MCG tablet Take 1 tablet by mouth daily. 1 Package 12   No current facility-administered medications for this visit.    Family History  Problem Relation Age of Onset  . Hypertension Mother   . Breast cancer Maternal Grandmother   . Cancer Maternal Grandfather     lung  . Cancer Paternal Grandfather     prostate  . Heart disease Paternal Grandfather     ROS:  Pertinent items are noted in HPI.  Otherwise, a comprehensive ROS was negative.  Exam:   BP 102/70 mmHg  Pulse 70  Resp 16  Ht 5' 5.25" (1.657 m)  Wt 226 lb (102.513 kg)  BMI 37.34 kg/m2  LMP 08/17/2014 Height:  5' 5.25" (165.7 cm) Ht Readings from Last 3 Encounters:  09/01/14 5' 5.25" (1.657 m)  06/28/13 5' 5.25" (1.657 m)    General appearance: alert, cooperative and appears stated age Head: Normocephalic, without obvious abnormality, atraumatic Neck: no adenopathy, supple, symmetrical, trachea midline and thyroid normal to inspection and palpation Lungs: clear to auscultation bilaterally Breasts: normal appearance, no masses or tenderness, No nipple retraction or dimpling, No nipple discharge or bleeding, No axillary or supraclavicular adenopathy, large pendulous Heart: regular rate and rhythm Abdomen: soft, non-tender; no masses,  no organomegaly Extremities: extremities normal, atraumatic, no cyanosis or edema Skin: Skin color, texture, turgor normal. No rashes or lesions Lymph nodes: Cervical, supraclavicular, and axillary nodes normal. No abnormal inguinal nodes palpated Neurologic: Grossly normal   Pelvic: External genitalia:  no lesions              Urethra:  normal appearing urethra with no masses, tenderness or lesions              Bartholin's and Skene's: normal                 Vagina: normal appearing vagina with normal color and discharge, no lesions              Cervix: normal, non tender, no lesions, bleeding with pap only              Pap taken: Yes.  Bimanual Exam:  Uterus:  normal size, contour, position, consistency, mobility, non-tender              Adnexa: normal adnexa and no mass, fullness, tenderness               Rectovaginal: Confirms               Anus:  normal appearance  Chaperone present: Yes  A:  Well Woman with normal exam  Contraception OCP desired  STD screening   Lipid screening  Morbid obesity with weight loss plan  P:   Reviewed health and wellness pertinent to exam  Rx Triphasic see order  Labs, GC, Chlamydia, HIV, RPR  Lab Lipid panel  Discussed a good weight and exercise plan she is starting, encouraged to stay with it!!  Pap smear taken  today with HPV reflex   counseled on breast self exam, STD prevention, HIV risk factors and prevention, use and side effects of OCP's, adequate intake of calcium and vitamin D, diet and exercise  return annually or prn  An After Visit Summary was printed and given to the patient.

## 2014-09-01 NOTE — Patient Instructions (Signed)
General topics  Next pap or exam is  due in 1 year Take a Women's multivitamin Take 1200 mg. of calcium daily - prefer dietary If any concerns in interim to call back  Breast Self-Awareness Practicing breast self-awareness may pick up problems early, prevent significant medical complications, and possibly save your life. By practicing breast self-awareness, you can become familiar with how your breasts look and feel and if your breasts are changing. This allows you to notice changes early. It can also offer you some reassurance that your breast health is good. One way to learn what is normal for your breasts and whether your breasts are changing is to do a breast self-exam. If you find a lump or something that was not present in the past, it is best to contact your caregiver right away. Other findings that should be evaluated by your caregiver include nipple discharge, especially if it is bloody; skin changes or reddening; areas where the skin seems to be pulled in (retracted); or new lumps and bumps. Breast pain is seldom associated with cancer (malignancy), but should also be evaluated by a caregiver. BREAST SELF-EXAM The best time to examine your breasts is 5 7 days after your menstrual period is over.  ExitCare Patient Information 2013 [employer].   Exercise to Stay Healthy Exercise helps you become and stay healthy. EXERCISE IDEAS AND TIPS Choose exercises that:  You enjoy.  Fit into your day. You do not need to exercise really hard to be healthy. You can do exercises at a slow or medium level and stay healthy. You can:  Stretch before and after working out.  Try yoga, Pilates, or tai chi.  Lift weights.  Walk fast, swim, jog, run, climb stairs, bicycle, dance, or rollerskate.  Take aerobic classes. Exercises that burn about 150 calories:  Running 1  miles in 15 minutes.  Playing volleyball for 45 to 60 minutes.  Washing and waxing a car for 45 to 60  minutes.  Playing touch football for 45 minutes.  Walking 1  miles in 35 minutes.  Pushing a stroller 1  miles in 30 minutes.  Playing basketball for 30 minutes.  Raking leaves for 30 minutes.  Bicycling 5 miles in 30 minutes.  Walking 2 miles in 30 minutes.  Dancing for 30 minutes.  Shoveling snow for 15 minutes.  Swimming laps for 20 minutes.  Walking up stairs for 15 minutes.  Bicycling 4 miles in 15 minutes.  Gardening for 30 to 45 minutes.  Jumping rope for 15 minutes.  Washing windows or floors for 45 to 60 minutes. Document Released: 05/07/2010 Document Revised: 06/27/2011 Document Reviewed: 05/07/2010 ExitCare Patient Information 2013 [employer].   Other topics ( that may be useful information):    Sexually Transmitted Disease Sexually transmitted disease (STD) refers to any infection that is passed from person to person during sexual activity. This may happen by way of saliva, semen, blood, vaginal mucus, or urine. Common STDs include:  Gonorrhea.  Chlamydia.  Syphilis.  HIV/AIDS.  Genital herpes.  Hepatitis B and C.  Trichomonas.  Human papillomavirus (HPV).  Pubic lice. CAUSES  An STD may be spread by bacteria, virus, or parasite. A person can get an STD by:  Sexual intercourse with an infected person.  Sharing sex toys with an infected person.  Sharing needles with an infected person.  Having intimate contact with the genitals, mouth, or rectal areas of an infected person. SYMPTOMS  Some people may not have any symptoms, but   they can still pass the infection to others. Different STDs have different symptoms. Symptoms include:  Painful or bloody urination.  Pain in the pelvis, abdomen, vagina, anus, throat, or eyes.  Skin rash, itching, irritation, growths, or sores (lesions). These usually occur in the genital or anal area.  Abnormal vaginal discharge.  Penile discharge in men.  Soft, flesh-colored skin growths in the  genital or anal area.  Fever.  Pain or bleeding during sexual intercourse.  Swollen glands in the groin area.  Yellow skin and eyes (jaundice). This is seen with hepatitis. DIAGNOSIS  To make a diagnosis, your caregiver may:  Take a medical history.  Perform a physical exam.  Take a specimen (culture) to be examined.  Examine a sample of discharge under a microscope.  Perform blood test TREATMENT   Chlamydia, gonorrhea, trichomonas, and syphilis can be cured with antibiotic medicine.  Genital herpes, hepatitis, and HIV can be treated, but not cured, with prescribed medicines. The medicines will lessen the symptoms.  Genital warts from HPV can be treated with medicine or by freezing, burning (electrocautery), or surgery. Warts may come back.  HPV is a virus and cannot be cured with medicine or surgery.However, abnormal areas may be followed very closely by your caregiver and may be removed from the cervix, vagina, or vulva through office procedures or surgery. If your diagnosis is confirmed, your recent sexual partners need treatment. This is true even if they are symptom-free or have a negative culture or evaluation. They should not have sex until their caregiver says it is okay. HOME CARE INSTRUCTIONS  All sexual partners should be informed, tested, and treated for all STDs.  Take your antibiotics as directed. Finish them even if you start to feel better.  Only take over-the-counter or prescription medicines for pain, discomfort, or fever as directed by your caregiver.  Rest.  Eat a balanced diet and drink enough fluids to keep your urine clear or pale yellow.  Do not have sex until treatment is completed and you have followed up with your caregiver. STDs should be checked after treatment.  Keep all follow-up appointments, Pap tests, and blood tests as directed by your caregiver.  Only use latex condoms and water-soluble lubricants during sexual activity. Do not use  petroleum jelly or oils.  Avoid alcohol and illegal drugs.  Get vaccinated for HPV and hepatitis. If you have not received these vaccines in the past, talk to your caregiver about whether one or both might be right for you.  Avoid risky sex practices that can break the skin. The only way to avoid getting an STD is to avoid all sexual activity.Latex condoms and dental dams (for oral sex) will help lessen the risk of getting an STD, but will not completely eliminate the risk. SEEK MEDICAL CARE IF:   You have a fever.  You have any new or worsening symptoms. Document Released: 06/25/2002 Document Revised: 06/27/2011 Document Reviewed: 07/02/2010 [facility] Patient Information 2013 [place].    Domestic Abuse You are being battered or abused if someone close to you hits, pushes, or physically hurts you in any way. You also are being abused if you are forced into activities. You are being sexually abused if you are forced to have sexual contact of any kind. You are being emotionally abused if you are made to feel worthless or if you are constantly threatened. It is important to remember that help is available. No one has the right to abuse you. PREVENTION OF FURTHER  ABUSE  Learn the warning signs of danger. This varies with situations but may include: the use of alcohol, threats, isolation from friends and family, or forced sexual contact. Leave if you feel that violence is going to occur.  If you are attacked or beaten, report it to the police so the abuse is documented. You do not have to press charges. The police can protect you while you or the attackers are leaving. Get the officer's name and badge number and a copy of the report.  Find someone you can trust and tell them what is happening to you: your caregiver, a nurse, clergy member, close friend or family member. Feeling ashamed is natural, but remember that you have done nothing wrong. No one deserves abuse. Document Released:  04/01/2000 Document Revised: 06/27/2011 Document Reviewed: 06/10/2010 ExitCare Patient Information 2013 [employer].    How Much is Too Much Alcohol? Drinking too much alcohol can cause injury, accidents, and health problems. These types of problems can include:   Car crashes.  Falls.  Family fighting (domestic violence).  Drowning.  Fights.  Injuries.  Burns.  Damage to certain organs.  Having a baby with birth defects. ONE DRINK CAN BE TOO MUCH WHEN YOU ARE:  Working.  Pregnant or breastfeeding.  Taking medicines. Ask your doctor.  Driving or planning to drive. If you or someone you know has a drinking problem, get help from a doctor.  Document Released: 01/29/2009 Document Revised: 06/27/2011 Document Reviewed: 01/29/2009 ExitCare Patient Information 2013 [employer].   Smoking Hazards Smoking cigarettes is extremely bad for your health. Tobacco smoke has over 200 known poisons in it. There are over 60 chemicals in tobacco smoke that cause cancer. Some of the chemicals found in cigarette smoke include:   Cyanide.  Benzene.  Formaldehyde.  Methanol (wood alcohol).  Acetylene (fuel used in welding torches).  Ammonia. Cigarette smoke also contains the poisonous gases nitrogen oxide and carbon monoxide.  Cigarette smokers have an increased risk of many serious medical problems and Smoking causes approximately:  90% of all lung cancer deaths in men.  80% of all lung cancer deaths in women.  90% of deaths from chronic obstructive lung disease. Compared with nonsmokers, smoking increases the risk of:  Coronary heart disease by 2 to 4 times.  Stroke by 2 to 4 times.  Men developing lung cancer by 23 times.  Women developing lung cancer by 13 times.  Dying from chronic obstructive lung diseases by 12 times.  . Smoking is the most preventable cause of death and disease in our society.  WHY IS SMOKING ADDICTIVE?  Nicotine is the chemical  agent in tobacco that is capable of causing addiction or dependence.  When you smoke and inhale, nicotine is absorbed rapidly into the bloodstream through your lungs. Nicotine absorbed through the lungs is capable of creating a powerful addiction. Both inhaled and non-inhaled nicotine may be addictive.  Addiction studies of cigarettes and spit tobacco show that addiction to nicotine occurs mainly during the teen years, when young people begin using tobacco products. WHAT ARE THE BENEFITS OF QUITTING?  There are many health benefits to quitting smoking.   Likelihood of developing cancer and heart disease decreases. Health improvements are seen almost immediately.  Blood pressure, pulse rate, and breathing patterns start returning to normal soon after quitting. QUITTING SMOKING   American Lung Association - 1-800-LUNGUSA  American Cancer Society - 1-800-ACS-2345 Document Released: 05/12/2004 Document Revised: 06/27/2011 Document Reviewed: 01/14/2009 ExitCare Patient Information 2013 ExitCare,   LLC.   Stress Management Stress is a state of physical or mental tension that often results from changes in your life or normal routine. Some common causes of stress are:  Death of a loved one.  Injuries or severe illnesses.  Getting fired or changing jobs.  Moving into a new home. Other causes may be:  Sexual problems.  Business or financial losses.  Taking on a large debt.  Regular conflict with someone at home or at work.  Constant tiredness from lack of sleep. It is not just bad things that are stressful. It may be stressful to:  Win the lottery.  Get married.  Buy a new car. The amount of stress that can be easily tolerated varies from person to person. Changes generally cause stress, regardless of the types of change. Too much stress can affect your health. It may lead to physical or emotional problems. Too little stress (boredom) may also become stressful. SUGGESTIONS TO  REDUCE STRESS:  Talk things over with your family and friends. It often is helpful to share your concerns and worries. If you feel your problem is serious, you may want to get help from a professional counselor.  Consider your problems one at a time instead of lumping them all together. Trying to take care of everything at once may seem impossible. List all the things you need to do and then start with the most important one. Set a goal to accomplish 2 or 3 things each day. If you expect to do too many in a single day you will naturally fail, causing you to feel even more stressed.  Do not use alcohol or drugs to relieve stress. Although you may feel better for a short time, they do not remove the problems that caused the stress. They can also be habit forming.  Exercise regularly - at least 3 times per week. Physical exercise can help to relieve that "uptight" feeling and will relax you.  The shortest distance between despair and hope is often a good night's sleep.  Go to bed and get up on time allowing yourself time for appointments without being rushed.  Take a short "time-out" period from any stressful situation that occurs during the day. Close your eyes and take some deep breaths. Starting with the muscles in your face, tense them, hold it for a few seconds, then relax. Repeat this with the muscles in your neck, shoulders, hand, stomach, back and legs.  Take good care of yourself. Eat a balanced diet and get plenty of rest.  Schedule time for having fun. Take a break from your daily routine to relax. HOME CARE INSTRUCTIONS   Call if you feel overwhelmed by your problems and feel you can no longer manage them on your own.  Return immediately if you feel like hurting yourself or someone else. Document Released: 09/28/2000 Document Revised: 06/27/2011 Document Reviewed: 05/21/2007 [facility] Patient Information 2013 [place].  Exercise to Lose Weight Exercise and a healthy diet  may help you lose weight. Your doctor may suggest specific exercises. EXERCISE IDEAS AND TIPS  Choose low-cost things you enjoy doing, such as walking, bicycling, or exercising to workout videos.  Take stairs instead of the elevator.  Walk during your lunch break.  Park your car further away from work or school.  Go to a gym or an exercise class.  Start with 5 to 10 minutes of exercise each day. Build up to 30 minutes of exercise 4 to 6 days a week.  Wear shoes with good support and comfortable clothes.  Stretch before and after working out.  Work out until you breathe harder and your heart beats faster.  Drink extra water when you exercise.  Do not do so much that you hurt yourself, feel dizzy, or get very short of breath. Exercises that burn about 150 calories:  Running 1  miles in 15 minutes.  Playing volleyball for 45 to 60 minutes.  Washing and waxing a car for 45 to 60 minutes.  Playing touch football for 45 minutes.  Walking 1  miles in 35 minutes.  Pushing a stroller 1  miles in 30 minutes.  Playing basketball for 30 minutes.  Raking leaves for 30 minutes.  Bicycling 5 miles in 30 minutes.  Walking 2 miles in 30 minutes.  Dancing for 30 minutes.  Shoveling snow for 15 minutes.  Swimming laps for 20 minutes.  Walking up stairs for 15 minutes.  Bicycling 4 miles in 15 minutes.  Gardening for 30 to 45 minutes.  Jumping rope for 15 minutes.  Washing windows or floors for 45 to 60 minutes. Document Released: 05/07/2010 Document Revised: 06/27/2011 Document Reviewed: 05/07/2010 [facility] Patient Information 2015 [place], [place]. This information is not intended to replace advice given to you by your health care provider. Make sure you discuss any questions you have with your health care provider.

## 2014-09-02 LAB — STD PANEL
HIV 1&2 Ab, 4th Generation: NONREACTIVE
Hepatitis B Surface Ag: NEGATIVE

## 2014-09-02 NOTE — Progress Notes (Signed)
Reviewed personally.  M. Suzanne Cleon Thoma, MD.  

## 2014-09-03 ENCOUNTER — Telehealth: Payer: Self-pay

## 2014-09-03 ENCOUNTER — Other Ambulatory Visit: Payer: Self-pay | Admitting: Certified Nurse Midwife

## 2014-09-03 DIAGNOSIS — R899 Unspecified abnormal finding in specimens from other organs, systems and tissues: Secondary | ICD-10-CM

## 2014-09-03 DIAGNOSIS — R87612 Low grade squamous intraepithelial lesion on cytologic smear of cervix (LGSIL): Secondary | ICD-10-CM

## 2014-09-03 LAB — IPS N GONORRHOEA AND CHLAMYDIA BY PCR

## 2014-09-03 LAB — HEMOGLOBIN, FINGERSTICK: HEMOGLOBIN, FINGERSTICK: 14.3 g/dL (ref 12.0–16.0)

## 2014-09-03 LAB — IPS PAP TEST WITH REFLEX TO HPV

## 2014-09-03 NOTE — Telephone Encounter (Signed)
Spoke with patient. Advised patient of message and results as seen below. Patient is agreeable. Patient is not currently on any form of OCP. Patient will call with first day of next menses to schedule colposcopy. 3 month lab appointment for lipid panel recheck scheduled for 12/05/2014 at 10am. Patient is agreeable to date and time. Will place in colpo hold.  Routing to provider for final review. Patient agreeable to disposition. Will close encounter.

## 2014-09-03 NOTE — Telephone Encounter (Signed)
Left message to call Dominique Thompson at [phone].   Notes Recorded by Verner Choleborah S Leonard, CNM on 09/03/2014 at 1:52 PM Notify GC, Chlamydia, Hep B, HIV, RPR, HSV 1,2 are negative Cholesterol is elevated at 220 and LDL cholesterol is elevated. Patient needs to work on decrease cholesterol foods in diet, work on non fried fresh foods, lean meat, minimal fast foods. Work on weight loss and exercise daily Recheck in 3 months fasting order in

## 2014-09-03 NOTE — Telephone Encounter (Signed)
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

## 2014-09-16 ENCOUNTER — Telehealth: Payer: Self-pay | Admitting: Certified Nurse Midwife

## 2014-09-16 NOTE — Telephone Encounter (Signed)
Left message to call Kaitlyn at [phone]. 

## 2014-09-16 NOTE — Telephone Encounter (Signed)
Spoke with patient. Patient started her cycle on 5/28 and would like to schedule her colposcopy. Appointment scheduled for 6/2 at 2pm with Verner Choleborah S. Leonard CNM. Patient is agreeable to date and time.   Instructions given. Motrin 800 mg po x , one hour before appointment with food. Make sure to eat a meal before appointment and drink plenty of fluids. Patient verbalized understanding and will call to reschedule if will be on menses or has any concerns regarding pregnancy.   Routing to provider for final review. Patient agreeable to disposition. Will close encounter.   Patient aware provider will review message and nurse will return call if any additional advice or change of disposition.

## 2014-09-16 NOTE — Telephone Encounter (Signed)
Patient was told to call and schedule her procedure when her cycle started. Last seen 09/01/14.

## 2014-09-17 ENCOUNTER — Telehealth: Payer: Self-pay | Admitting: Certified Nurse Midwife

## 2014-09-17 NOTE — Telephone Encounter (Signed)
Patient returned call. I advised of benefit quote received for colpo. Patient agreeable. °

## 2014-09-17 NOTE — Telephone Encounter (Signed)
Left message for patient to call back. Need to go over benefits for colpo that is scheduled 06.02.2016.

## 2014-09-18 ENCOUNTER — Ambulatory Visit (INDEPENDENT_AMBULATORY_CARE_PROVIDER_SITE_OTHER): Payer: 59 | Admitting: Certified Nurse Midwife

## 2014-09-18 ENCOUNTER — Encounter: Payer: Self-pay | Admitting: Certified Nurse Midwife

## 2014-09-18 DIAGNOSIS — R87612 Low grade squamous intraepithelial lesion on cytologic smear of cervix (LGSIL): Secondary | ICD-10-CM

## 2014-09-18 NOTE — Patient Instructions (Signed)

## 2014-09-18 NOTE — Progress Notes (Addendum)
Patient ID: Dominique Thompson, female   DOB: 06/20/1985, 29 y.o.   MRN: [id number]  Chief Complaint  Patient presents with  . Colposcopy    HPI Dominique SayresLauren E Thompson is a 29 y.o. white g0p0 single female here for colposcopy exam. Denies vaginal bleeding or pelvic pain.   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 Previous colposcopy: none  Prior cervical treatment: N/A.  Past Medical History  Diagnosis Date  . Abnormal Pap smear of cervix     09-01-14 LGSIL    Past Surgical History  Procedure Laterality Date  . Tonsillectomy      Family History  Problem Relation Age of Onset  . Hypertension Mother   . Breast cancer Maternal Grandmother   . Cancer Maternal Grandfather     lung  . Cancer Paternal Grandfather     prostate  . Heart disease Paternal Grandfather     Social History History  Substance Use Topics  . Smoking status: Never Smoker   . Smokeless tobacco: Not on file  . Alcohol Use: No    Allergies  Allergen Reactions  . Latex Other (See Comments)    Patient stated that she was told she has an allergy to latex but she isn't sure    Current Outpatient Prescriptions  Medication Sig Dispense Refill  . Norgestimate-Ethinyl Estradiol Triphasic 0.18/0.215/0.25 MG-35 MCG tablet Take 1 tablet by mouth daily. 1 Package 12   No current facility-administered medications for this visit.    Review of Systems Review of Systems  Constitutional: Negative.   Genitourinary: Negative for vaginal bleeding, vaginal discharge and vaginal pain.    Blood pressure 118/76, pulse 70, resp. rate 20, height 5' 5.25" (1.657 m), weight 227 lb (102.967 kg), last menstrual period 09/13/2014.  Physical Exam Physical Exam  Constitutional: She is oriented to person, place, and time. She appears well-developed and well-nourished.  Genitourinary: Vagina normal and uterus normal.    Neurological:  She is alert and oriented to person, place, and time.  Skin: Skin is warm and dry.  Psychiatric: She has a normal mood and affect. Her behavior is normal. Judgment and thought content normal.    Data Reviewed Reviewed pap smear results and  Questions addressed.  Assessment    Procedure Details  The risks and benefits of the procedure and Written informed consent obtained.  Speculum placed in vagina and excellent visualization of cervix achieved, cervix swabbed x 3 with saline and  acetic acid solution. Slight ? HPV effect noted at 2 o'clock. Lugol's applied and non staining noted at cervical edge at 2 o'clock. Biopsy taken. ECC obtained. Monsel's applied. No active bleeding noted on removal of speculum. Patient tolerated procedure well. Instructions given.  Specimens: 2  Complications: none.     Plan    Specimens labelled and sent to Pathology. Patient will be contacted with results when reviewed.   Pathology results: Cervical biopsy at 2 o'clock showed koilocytotic atypia consistent with HPV effect(LSIL). ECC showed benign endocervical epithelium, negative for dysplasia or atypia. Correlated well with pap results. Patient will be notified of results and need for repeat pap smear in one year. Pap recall placed.   Daron Breeding 09/18/2014, 2:10 PM

## 2014-09-18 NOTE — Progress Notes (Signed)
09-01-14 pap showed LGSIL  pt took 800mg  ibuprofen at 1pm

## 2014-09-19 NOTE — Progress Notes (Signed)
Reviewed personally.  M. Suzanne Justin Meisenheimer, MD.  

## 2014-09-22 LAB — IPS OTHER TISSUE BIOPSY

## 2014-09-25 ENCOUNTER — Telehealth: Payer: Self-pay | Admitting: Emergency Medicine

## 2014-09-25 NOTE — Telephone Encounter (Signed)
Called patient and message from Dominique Thompson CNM given. Patient verbalized understanding of results. Stressed importance of pap smear again in one year.   Annual exam 09/08/15 with Dominique Thompson CNM 08 recall placed.  Routing to provider for final review. Patient agreeable to disposition. Will close encounter.

## 2014-09-25 NOTE — Telephone Encounter (Signed)
-----   Message from Verner Chol, CNM sent at 09/23/2014  7:50 AM EDT ----- Notify patient that biopsy of cervix showed koilocytotic atypia consistent with HPV effect. ECC showed benign endocervical epithelium negative for dysplasia or atypia all correlates well with pap result of LSIL. Needs repeat pap one year, important to have done for follow up. Pap recall 08

## 2014-12-05 ENCOUNTER — Telehealth: Payer: Self-pay | Admitting: Certified Nurse Midwife

## 2014-12-05 ENCOUNTER — Other Ambulatory Visit: Payer: 59

## 2014-12-05 NOTE — Telephone Encounter (Signed)
LMTCB about missed lab appointment.

## 2014-12-17 ENCOUNTER — Ambulatory Visit (INDEPENDENT_AMBULATORY_CARE_PROVIDER_SITE_OTHER): Payer: 59 | Admitting: Obstetrics and Gynecology

## 2014-12-17 ENCOUNTER — Encounter: Payer: Self-pay | Admitting: Obstetrics and Gynecology

## 2014-12-17 VITALS — BP 112/80 | HR 96 | Temp 98.7°F | Resp 16 | Ht 65.25 in | Wt 225.0 lb

## 2014-12-17 DIAGNOSIS — N760 Acute vaginitis: Secondary | ICD-10-CM | POA: Diagnosis not present

## 2014-12-17 NOTE — Progress Notes (Signed)
GYNECOLOGY  VISIT   HPI: 29 y.o.   Single  Caucasian  female   G0P0000 with Patient's last menstrual period was 12/04/2014.   here for Vaginitis    Started itching and having dryness 2 weeks ago.  Symptoms usually resolve after she had her menses.  Feels swollen.  A little discharge.  No odor.  No dysuria.   History of yeast infections but this feels different.  Has had BV in the past as well.  Negative GC/CT on 09/01/14.  This is a new partner since her last visit.  No condom use.  Patient has irritation with this.  No new products.   Started OCPs again in May 2016. Had been in this pill some months prior.   New wet bathing suits or recent abx.   GYNECOLOGIC HISTORY: Patient's last menstrual period was 12/04/2014. 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 Colpo: LSIL.         OB History    Gravida Para Term Preterm AB TAB SAB Ectopic Multiple Living           There are no active problems to display for this patient.   Past Medical History  Diagnosis Date  . Abnormal Pap smear of cervix     09-01-14 LGSIL    Past Surgical History  Procedure Laterality Date  . Tonsillectomy      Current Outpatient Prescriptions  Medication Sig Dispense Refill  . Norgestimate-Ethinyl Estradiol Triphasic 0.18/0.215/0.25 MG-35 MCG tablet Take 1 tablet by mouth daily. 1 Package 12   No current facility-administered medications for this visit.     ALLERGIES: Latex  Family History  Problem Relation Age of Onset  . Hypertension Mother   . Breast cancer Maternal Grandmother   . Cancer Maternal Grandfather     lung  . Cancer Paternal Grandfather     prostate  . Heart disease Paternal Grandfather     Social History   Social History  . Marital Status: Single    Spouse Name: N/A  . Number of Children: N/A  . Years of Education: N/A   Occupational History  . Not on file.   Social  History Main Topics  . Smoking status: Never Smoker   . Smokeless tobacco: Not on file  . Alcohol Use: No  . Drug Use: No  . Sexual Activity:    Partners: Male    Birth Control/ Protection: Pill   Other Topics Concern  . Not on file   Social History Narrative    ROS:  Pertinent items are noted in HPI.  PHYSICAL EXAMINATION:    BP 112/80 mmHg  Pulse 96  Temp(Src) 98.7 F (37.1 C) (Oral)  Resp 16  Ht 5' 5.25" (1.657 m)  Wt 225 lb (102.059 kg)  BMI 37.17 kg/m2  LMP 12/04/2014    General appearance: alert, cooperative and appears stated age   Pelvic: External genitalia:  no lesions              Urethra:  normal appearing urethra with no masses, tenderness or lesions              Bartholins and Skenes: normal                 Vagina: normal appearing vagina with normal color and discharge, no lesions.  White discharge with mild clumping.  Cervix: no lesions             Bimanual Exam:  Uterus:  normal size, contour, position, consistency, mobility, non-tender              Adnexa: normal adnexa and no mass, fullness, tenderness                Chaperone was present for exam.  ASSESSMENT  Vaginitis.  New partner.   PLAN  Discussed vaginitis. Affirm testing.  GC/CT Thin prep.  Discussed [place] bath.    An After Visit Summary was printed and given to the patient.  ___15___ minutes face to face time of which over 50% was spent in counseling.

## 2014-12-18 ENCOUNTER — Other Ambulatory Visit: Payer: Self-pay | Admitting: Obstetrics and Gynecology

## 2014-12-18 LAB — WET PREP BY MOLECULAR PROBE
CANDIDA SPECIES: POSITIVE — AB
GARDNERELLA VAGINALIS: POSITIVE — AB
Trichomonas vaginosis: NEGATIVE

## 2014-12-18 MED ORDER — METRONIDAZOLE 500 MG PO TABS: 500.0000 mg | ORAL_TABLET | Freq: Two times a day (BID) | ORAL | Status: DC

## 2014-12-18 MED ORDER — FLUCONAZOLE 150 MG PO TABS: 150.0000 mg | ORAL_TABLET | Freq: Once | ORAL | Status: DC

## 2014-12-19 ENCOUNTER — Telehealth: Payer: Self-pay | Admitting: Obstetrics and Gynecology

## 2014-12-19 LAB — IPS N GONORRHOEA AND CHLAMYDIA BY PCR

## 2014-12-19 NOTE — Telephone Encounter (Signed)
Notes Recorded by Patton Salles, MD on 12/18/2014 at 7:54 PM Results to patient through My Chart. Rx to her pharmacy for Diflucan and Flagyl. GC/CT pending.

## 2014-12-19 NOTE — Telephone Encounter (Signed)
Patient calling for results.

## 2014-12-19 NOTE — Telephone Encounter (Signed)
Spoke with patient. Advised patient of results and mychart message as seen below. Patient is agreeable and verbalizes understanding.  Entered by Patton Salles, MD at 12/18/2014 7:54 PM    Hi Dominique Thompson,   I have the molecular vaginitis testing back and it shows both bacterial vaginosis and yeast vaginitis.    I am recommending treatment with Diflucan 150 mg orally for the yeast. You may repeat this in 48 hours if your symptoms persist.   I am also recommending Flagyl 500 mg by mouth twice a day for one week for the bacterial vaginosis. This medication you cannot mix with alcohol or it will cause severe nausea and vomiting.   Abstain from intercourse while you are doing treatment.   I will send this to your pharmacy of record.   The gonorrhea and chlamydia testing are not back yet.   Please call the office tomorrow for any concerns.   Thank you, ,   Dominique Simmonds, MD      Routing to provider for final review. Patient agreeable to disposition. Will close encounter.

## 2015-06-03 ENCOUNTER — Encounter: Payer: Self-pay | Admitting: Obstetrics and Gynecology

## 2015-09-08 ENCOUNTER — Encounter: Payer: Self-pay | Admitting: Certified Nurse Midwife

## 2015-09-08 ENCOUNTER — Ambulatory Visit: Payer: 59 | Admitting: Certified Nurse Midwife

## 2015-09-11 ENCOUNTER — Encounter: Payer: Self-pay | Admitting: Obstetrics and Gynecology

## 2015-10-05 ENCOUNTER — Inpatient Hospital Stay (HOSPITAL_COMMUNITY)
Admission: AD | Admit: 2015-10-05 | Discharge: 2015-10-05 | Disposition: A | Discharge status: Home / Self Care | Payer: 59 | Source: Ambulatory Visit | Attending: Obstetrics & Gynecology | Admitting: Obstetrics & Gynecology

## 2015-10-05 ENCOUNTER — Encounter (HOSPITAL_COMMUNITY): Payer: Self-pay | Admitting: *Deleted

## 2015-10-05 ENCOUNTER — Inpatient Hospital Stay (HOSPITAL_COMMUNITY): Payer: 59

## 2015-10-05 DIAGNOSIS — O4691 Antepartum hemorrhage, unspecified, first trimester: Secondary | ICD-10-CM | POA: Diagnosis not present

## 2015-10-05 DIAGNOSIS — IMO0001 Reserved for inherently not codable concepts without codable children: Secondary | ICD-10-CM

## 2015-10-05 DIAGNOSIS — Z3A01 Less than 8 weeks gestation of pregnancy: Secondary | ICD-10-CM | POA: Diagnosis not present

## 2015-10-05 DIAGNOSIS — O209 Hemorrhage in early pregnancy, unspecified: Secondary | ICD-10-CM | POA: Insufficient documentation

## 2015-10-05 DIAGNOSIS — O3680X0 Pregnancy with inconclusive fetal viability, not applicable or unspecified: Secondary | ICD-10-CM

## 2015-10-05 DIAGNOSIS — Z91040 Latex allergy status: Secondary | ICD-10-CM | POA: Diagnosis not present

## 2015-10-05 DIAGNOSIS — R030 Elevated blood-pressure reading, without diagnosis of hypertension: Secondary | ICD-10-CM | POA: Diagnosis not present

## 2015-10-05 DIAGNOSIS — R1030 Lower abdominal pain, unspecified: Secondary | ICD-10-CM | POA: Diagnosis present

## 2015-10-05 LAB — URINE MICROSCOPIC-ADD ON

## 2015-10-05 LAB — URINALYSIS, ROUTINE W REFLEX MICROSCOPIC
Bilirubin Urine: NEGATIVE
Glucose, UA: NEGATIVE mg/dL
Ketones, ur: NEGATIVE mg/dL
LEUKOCYTES UA: NEGATIVE
Nitrite: NEGATIVE
PH: 6 (ref 5.0–8.0)
Protein, ur: NEGATIVE mg/dL
Specific Gravity, Urine: 1.015 (ref 1.005–1.030)

## 2015-10-05 LAB — WET PREP, GENITAL
CLUE CELLS WET PREP: NONE SEEN
SPERM: NONE SEEN
TRICH WET PREP: NONE SEEN
Yeast Wet Prep HPF POC: NONE SEEN

## 2015-10-05 LAB — CBC
HCT: 41.9 % (ref 36.0–46.0)
Hemoglobin: 14.5 g/dL (ref 12.0–15.0)
MCH: 30.9 pg (ref 26.0–34.0)
MCHC: 34.6 g/dL (ref 30.0–36.0)
MCV: 89.1 fL (ref 78.0–100.0)
Platelets: 406 10*3/uL — ABNORMAL HIGH (ref 150–400)
RBC: 4.7 MIL/uL (ref 3.87–5.11)
RDW: 13 % (ref 11.5–15.5)
WBC: 7.5 10*3/uL (ref 4.0–10.5)

## 2015-10-05 LAB — ABO/RH: ABO/RH(D): O POS

## 2015-10-05 LAB — POCT PREGNANCY, URINE: Preg Test, Ur: POSITIVE — AB

## 2015-10-05 LAB — HCG, QUANTITATIVE, PREGNANCY: hCG, Beta Chain, Quant, S: 24 m[IU]/mL — ABNORMAL HIGH (ref <5)

## 2015-10-05 NOTE — Discharge Instructions (Signed)

## 2015-10-05 NOTE — MAU Note (Signed)
Last week, was 4 days late for period.  Taken several HPT, all were positive.  Started having brown d/c  On Wed, has continued- change to red yesterday afternoon-only when she wipes.  Got a little heavier last night, noted a lot in the toilet this morning. Some cramping- not even like period cramping.

## 2015-10-05 NOTE — MAU Note (Deleted)
Asked pt to get clean catch urine.  Pt states she needs water to urinate.  NT informed pt we only needed a small sample to see how dehydrated she is and can not give her oral po at this time since she hasn't been tolerated them.  Pt given cup and pt urinated in bed to obtain non clean sample before tech could say something.

## 2015-10-05 NOTE — MAU Provider Note (Signed)
History     CSN: [id number]  Arrival date and time: 10/05/15 3016   First Provider Initiated Contact with Patient 10/05/15 1040      Chief Complaint  Patient presents with  . Vaginal Bleeding  . Abdominal Cramping  . Threatened Miscarriage   HPI   Dominique Thompson is a 30 y.o.female G1P0000 @ [redacted]w[redacted]d Caucasian female who presented to the MAU complaining of lower abdominal pain and intermittent spotting since last Wednesday. UPT positive at home and positive here in MAU today. She states that the bleeding is only noted on toilet paper when she wipes. Bleeding began as dark brown and has since progressed to red blood, with one episode of large-volume bleeding that "filled the toilet bowl" upon waking this morning. Patient endorses lower abdominal cramping that is a 2/10 that subjectively does not resemble her normal period cramps. Cramping has not increased over time. Denies vomiting but endorses some episodes of slight nausea. Endorses some intermittent bouts of dizziness, although these appear to be normal for her. Denies history of STDs and PID. Denies psychosocial stressors other than the current situation. Denies recent physical injury, fall, MVA or drug use.    OB History    Gravida Para Term Preterm AB TAB SAB Ectopic Multiple Living        Past Medical History  Diagnosis Date  . Abnormal Pap smear of cervix     09-01-14 LGSIL    Past Surgical History  Procedure Laterality Date  . Tonsillectomy      Family History  Problem Relation Age of Onset  . Hypertension Mother   . Fibroids Mother   . Ovarian cysts Mother   . Breast cancer Maternal Grandmother   . Cancer Maternal Grandfather     lung  . Cancer Paternal Grandfather     prostate  . Heart disease Paternal Grandfather     Social History  Substance Use Topics  . Smoking status: Never Smoker   . Smokeless tobacco: None  . Alcohol Use: No    Allergies:  Allergies  Allergen Reactions  .  Latex Other (See Comments)    Patient stated that she was told she has an allergy to latex but she isn't sure    Prescriptions prior to admission  Medication Sig Dispense Refill Last Dose  . fluconazole (DIFLUCAN) 150 MG tablet Take 1 tablet (150 mg total) by mouth once. Take one tablet.  Repeat in 48 hours if symptoms are not completely resolved. 2 tablet 0   . metroNIDAZOLE (FLAGYL) 500 MG tablet Take 1 tablet (500 mg total) by mouth 2 (two) times daily. 14 tablet 0   . Norgestimate-Ethinyl Estradiol Triphasic 0.18/0.215/0.25 MG-35 MCG tablet Take 1 tablet by mouth daily. 1 Package 12 Taking   Results for orders placed or performed during the hospital encounter of 10/05/15 (from the past 48 hour(s))  Urinalysis, Routine w reflex microscopic (not at [facility] ([place]     Status: Abnormal   Collection Time: 10/05/15 10:12 AM  Result Value Ref Range   Color, Urine YELLOW YELLOW   APPearance CLOUDY (A) CLEAR   Specific Gravity, Urine 1.015 1.005 - 1.030   pH 6.0 5.0 - 8.0   Glucose, UA NEGATIVE NEGATIVE mg/dL   Hgb urine dipstick LARGE (A) NEGATIVE   Bilirubin Urine NEGATIVE NEGATIVE   Ketones, ur NEGATIVE NEGATIVE mg/dL   Protein, ur NEGATIVE NEGATIVE mg/dL   Nitrite NEGATIVE NEGATIVE   Leukocytes, UA NEGATIVE  NEGATIVE  Urine microscopic-add on     Status: Abnormal   Collection Time: 10/05/15 10:12 AM  Result Value Ref Range   Squamous Epithelial / LPF 0-5 (A) NONE SEEN   WBC, UA 0-5 0 - 5 WBC/hpf   RBC / HPF 6-30 0 - 5 RBC/hpf   Bacteria, UA FEW (A) NONE SEEN  Pregnancy, urine POC     Status: Abnormal   Collection Time: 10/05/15 10:20 AM  Result Value Ref Range   Preg Test, Ur POSITIVE (A) NEGATIVE    Comment:        THE SENSITIVITY OF THIS METHODOLOGY IS >24 mIU/mL   CBC     Status: Abnormal   Collection Time: 10/05/15 11:13 AM  Result Value Ref Range   WBC 7.5 4.0 - 10.5 K/uL   RBC 4.70 3.87 - 5.11 MIL/uL   Hemoglobin 14.5 12.0 - 15.0 g/dL   HCT 16.141.9 09.636.0 - 04.546.0 %   MCV 89.1 78.0  - 100.0 fL   MCH 30.9 26.0 - 34.0 pg   MCHC 34.6 30.0 - 36.0 g/dL   RDW 40.913.0 81.111.5 - 91.415.5 %   Platelets 406 (H) 150 - 400 K/uL  ABO/Rh     Status: None   Collection Time: 10/05/15 11:13 AM  Result Value Ref Range   ABO/RH(D) O POS   hCG, quantitative, pregnancy     Status: Abnormal   Collection Time: 10/05/15 11:13 AM  Result Value Ref Range   hCG, Beta Chain, Quant, S 24 (H) <5 mIU/mL    Comment:          GEST. AGE      CONC.  (mIU/mL)   <=1 WEEK        5 - 50     2 WEEKS       50 - 500     3 WEEKS       100 - 10,000     4 WEEKS     1,000 - 30,000     5 WEEKS     3,500 - 115,000   6-8 WEEKS     12,000 - 270,000    12 WEEKS     15,000 - 220,000        FEMALE AND NON-PREGNANT FEMALE:     LESS THAN 5 mIU/mL   Wet prep, genital     Status: Abnormal   Collection Time: 10/05/15 11:20 AM  Result Value Ref Range   Yeast Wet Prep HPF POC NONE SEEN NONE SEEN   Trich, Wet Prep NONE SEEN NONE SEEN   Clue Cells Wet Prep HPF POC NONE SEEN NONE SEEN   WBC, Wet Prep HPF POC FEW (A) NONE SEEN    Comment: MANY BACTERIA SEEN   Sperm NONE SEEN    Review of Systems  Constitutional: Negative.   Eyes: Negative.   Respiratory: Negative.   Cardiovascular: Negative.   Gastrointestinal: Positive for nausea and abdominal pain. Negative for vomiting.  Genitourinary: Negative.   Neurological: Positive for dizziness (intermittent).   Physical Exam   Blood pressure 129/82, pulse 96, temperature 98.3 F (36.8 C), temperature source Oral, resp. rate 16, height 5\' 5"  (1.651 m), weight 225 lb 6.4 oz (102.241 kg), last menstrual period 08/29/2015.  Physical Exam  Constitutional: She is oriented to person, place, and time. She appears well-developed and well-nourished. No distress.  HENT:  Head: Normocephalic.  Cardiovascular: Regular rhythm and intact distal pulses.   Respiratory: Breath sounds normal. No respiratory distress.  GI: Soft.  Genitourinary: Pelvic exam was performed with patient  supine. There is no rash, tenderness, lesion or injury on the right labia. There is no rash, tenderness, lesion or injury on the left labia. Uterus is tender. Uterus is not enlarged. Cervix exhibits discharge. Cervix exhibits no motion tenderness and no friability. Right adnexum displays no mass, no tenderness and no fullness. Left adnexum displays no mass, no tenderness and no fullness. There is bleeding (Small amount of dark red blood in the vagina. No clots noted ) in the vagina.  Musculoskeletal: Normal range of motion.  Neurological: She is alert and oriented to person, place, and time.  Skin: She is not diaphoretic.    MAU Course  Procedures  None  MDM CBC Rh/ABO Wet prep, HIV, GC HCG quant Transvaginal ultrasound  O positive blood type   Assessment and Plan   A:  1. Pregnancy of unknown anatomic location   2. Vaginal bleeding in pregnancy, first trimester   3. Elevated BP     P:  Discharge home in stable condition Prepare the patient that this is likely SAB in process; will confirm on Weds with 48 hour quant.  Support given Bleeding precautions Pelvic rest Return to MAU if symptoms worsen   Duane Lope, NP 10/05/2015 3:13 PM

## 2015-10-05 NOTE — MAU Note (Signed)
Positive UPT at home and here; c/o small red bleeding that started this AM; pt's bleeding has gone from pink to brown to red bleeding; last intercourse a week ago;

## 2015-10-06 LAB — HIV ANTIBODY (ROUTINE TESTING W REFLEX): HIV SCREEN 4TH GENERATION: NONREACTIVE

## 2015-10-06 LAB — GC/CHLAMYDIA PROBE AMP (~~LOC~~) NOT AT ARMC
Chlamydia: NEGATIVE
NEISSERIA GONORRHEA: NEGATIVE

## 2015-10-07 ENCOUNTER — Other Ambulatory Visit: Payer: 59

## 2015-10-07 DIAGNOSIS — O200 Threatened abortion: Secondary | ICD-10-CM

## 2015-10-07 LAB — HCG, QUANTITATIVE, PREGNANCY: HCG, BETA CHAIN, QUANT, S: 8 m[IU]/mL — AB (ref <5)

## 2015-10-07 NOTE — Progress Notes (Signed)
Patient here for stat bhcg today. Reports no bleeding or pain. Patient's chart reviewed with Dr Vergie LivingPickens who stated, labs consistent with SAB, needs follow up in one week. Informed patient of results & recommendation. Patient verbalized understanding & had no questions

## 2015-10-14 ENCOUNTER — Other Ambulatory Visit: Payer: 59

## 2016-08-09 ENCOUNTER — Encounter (HOSPITAL_COMMUNITY): Payer: Self-pay

## 2017-10-07 IMAGING — US US OB COMP LESS 14 WK
1 series · 15 of 28 positions shown · non-contrast
Comparison: 06/05/2012.

CLINICAL DATA: Pregnancy.  Pain.  Bleeding.

EXAM:
OBSTETRIC <14 WK US AND TRANSVAGINAL OB US
TECHNIQUE: Both transabdominal and transvaginal ultrasound examinations were
performed for complete evaluation of the gestation as well as the
maternal uterus, adnexal regions, and pelvic cul-de-sac.
Transvaginal technique was performed to assess early pregnancy.

[Series 1: us ob comp less 14 wk · 36 acquisitions, 15 frames shown]
[im 1/36]
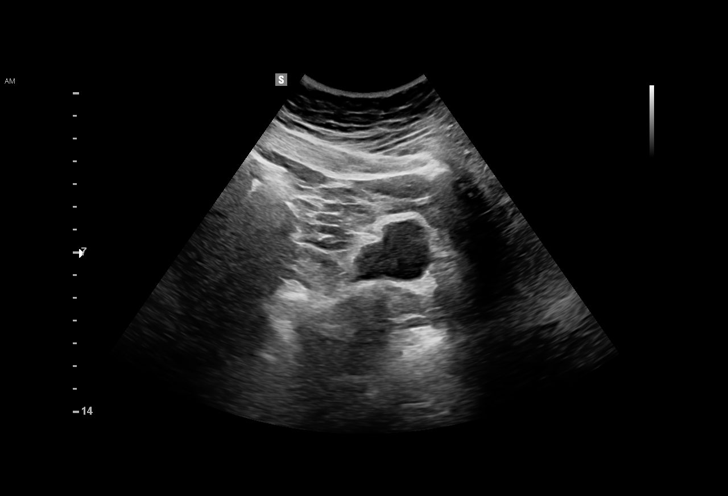
[im 3/36]
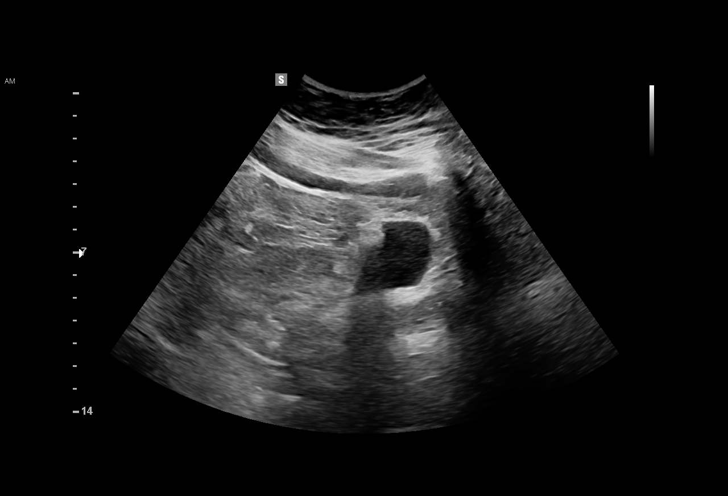
[im 6/36]
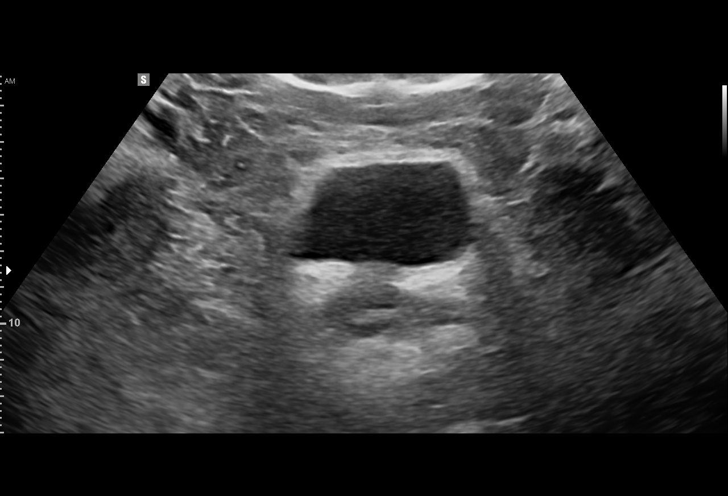
[im 8/36]
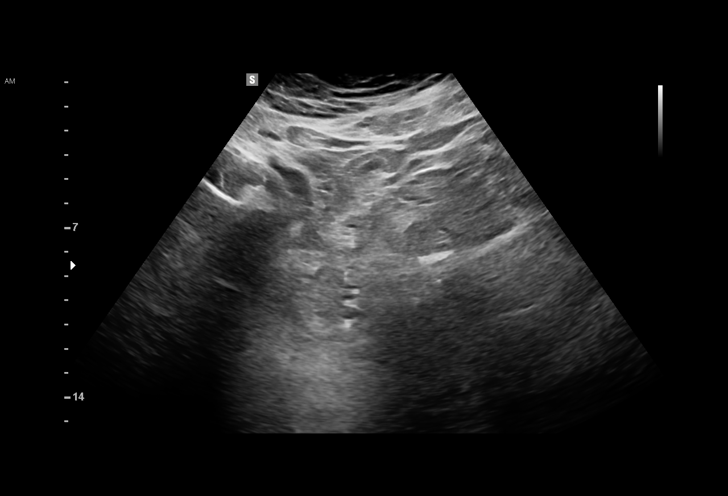
[im 11/36]
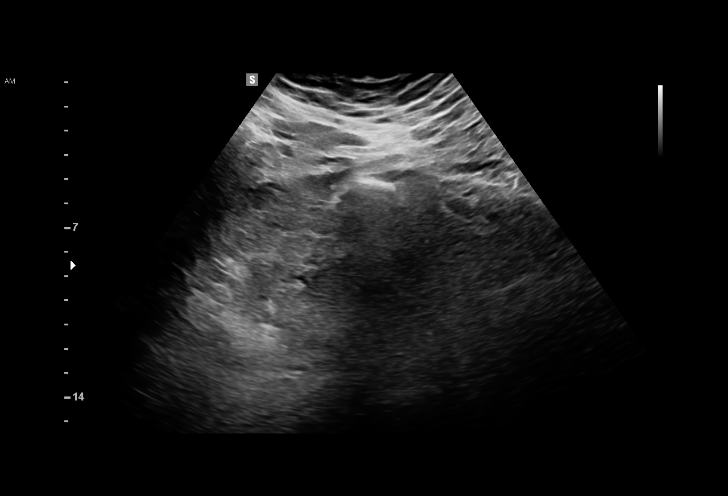
[im 13/36]
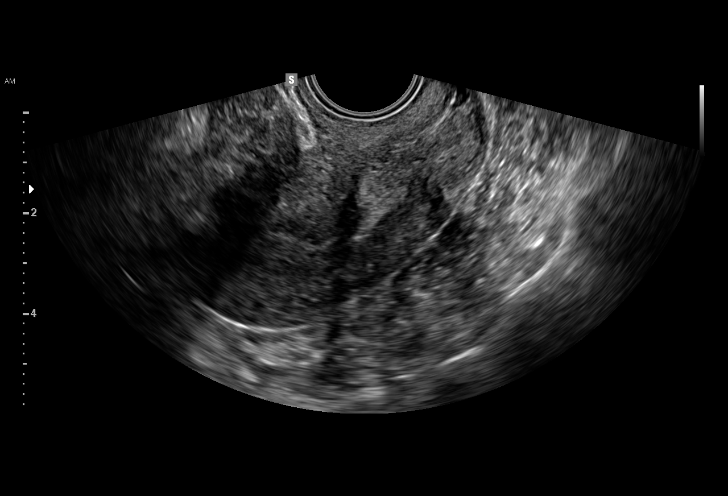
[im 16/36]
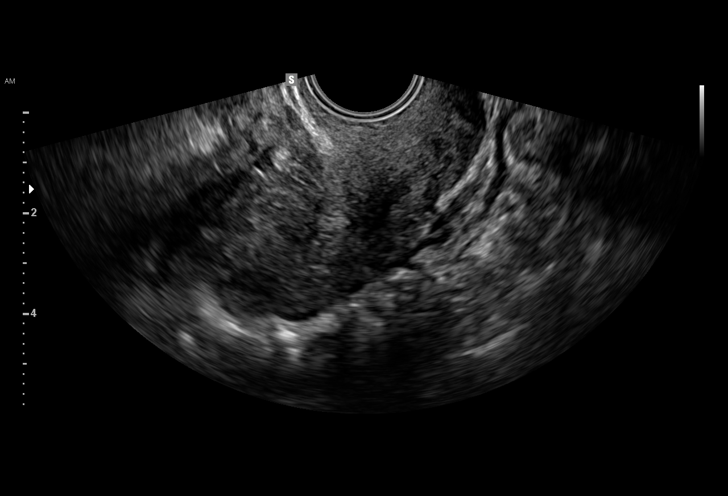
[im 19/36]
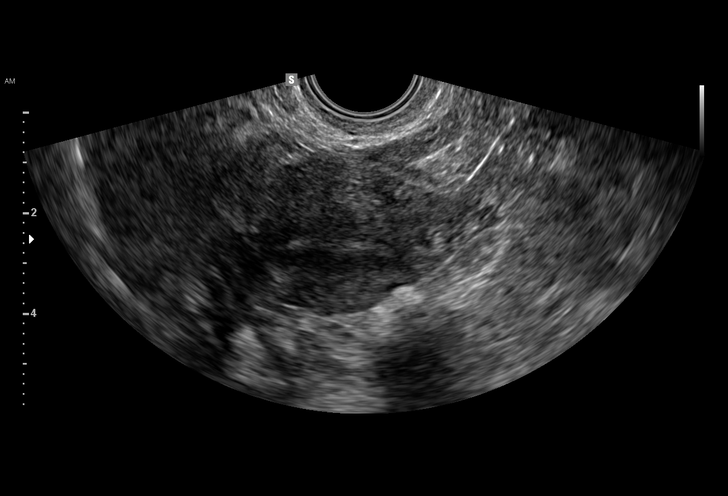
[im 20/36]
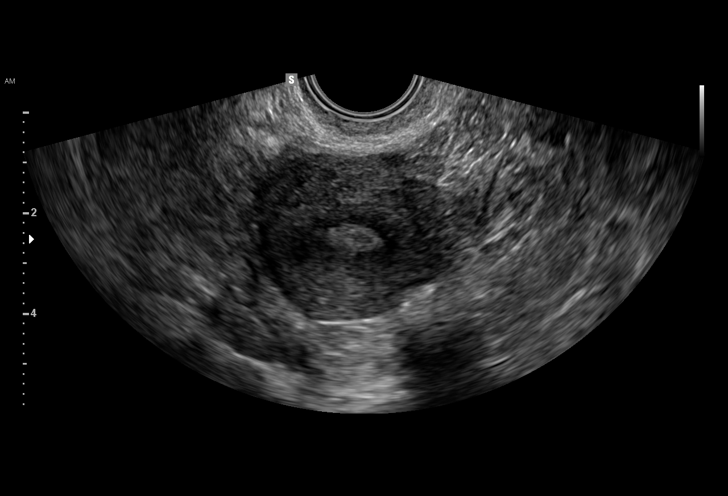
[im 23/36]
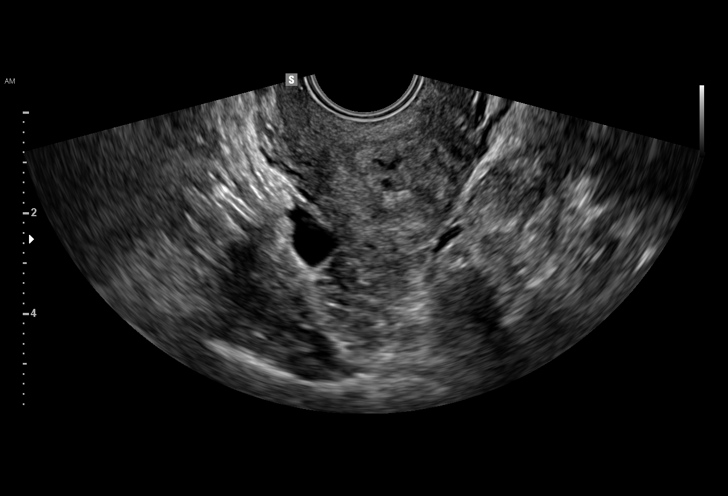
[im 25/36]
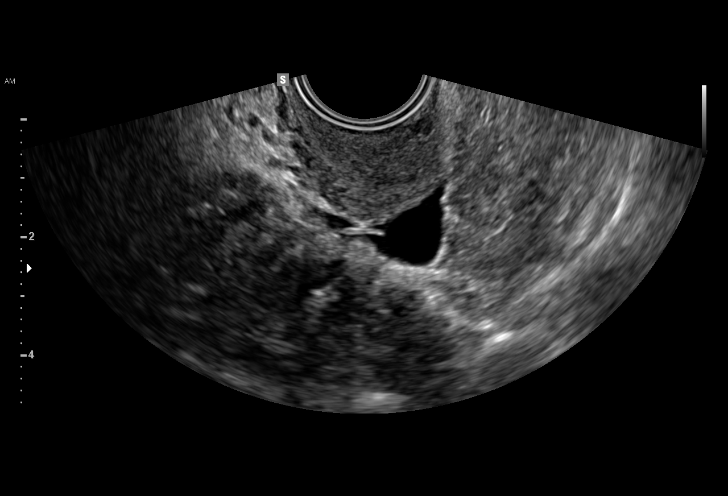
[im 28/36]
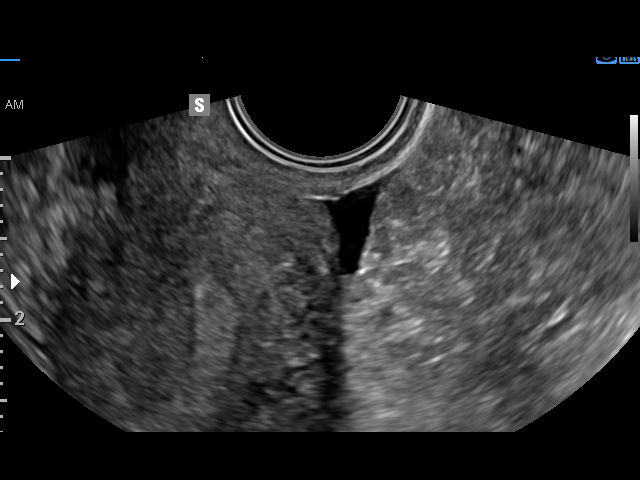
[im 30/36]
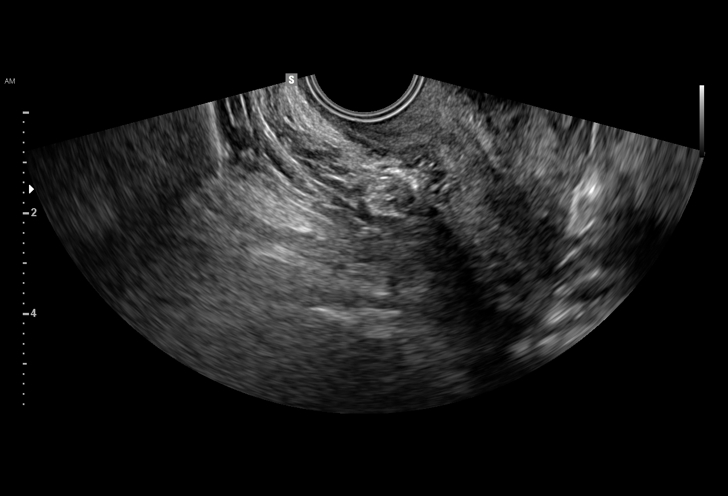
[im 33/36]
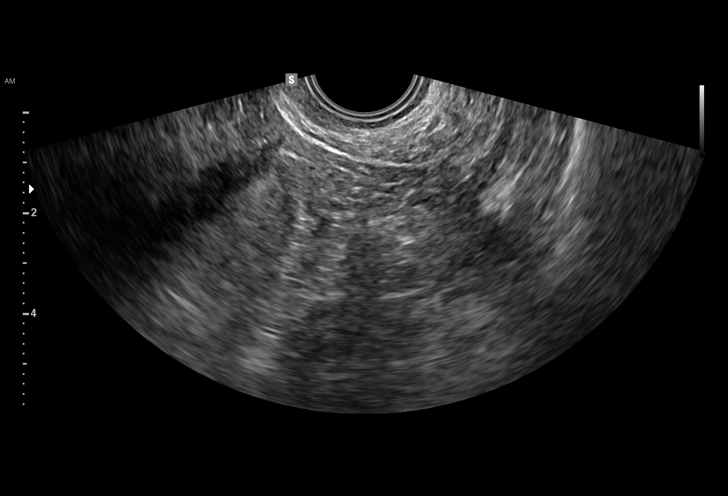
[im 36/36]
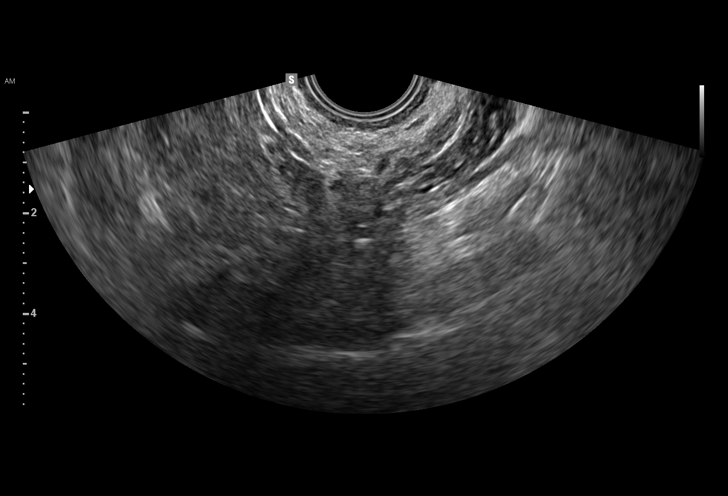

[15 of 28 positions shown; findings below may reference images not displayed]

FINDINGS: Intrauterine gestational sac: None

Yolk sac:  None

Embryo:  None

Cardiac Activity: None

Subchorionic hemorrhage:  None

Maternal uterus/adnexae: Ovaries not well identified. Approximately
1 cm cystic structure in the cul de sac region noted, this could
represent a simple ovarian cyst. Small amount of free pelvic fluid.
To exclude the possibly of ectopic pregnancy continued follow-up
beta hCGs and pelvic ultrasound suggested.
IMPRESSION: 1. No intrauterine gestational sac or intrauterine pregnancy
identified. A early or failed IUP cannot be excluded and follow-up
beta HCG and ultrasound suggested.

2. Small approximately 1 cm simple appearing cyst is noted in the
cul-de-sac region, this could be a simple ovarian cyst. Associated
small amount of free pelvic fluid noted. To exclude ectopic
pregnancy continued follow-up as above suggested.
